# Patient Record
Sex: Female | Born: 1975 | Hispanic: Yes | Marital: Married | Smoking: Never smoker
Health system: Southern US, Community
[De-identification: ages and names within clinical notes are randomized; demographics above are authoritative.]

## PROBLEM LIST (undated history)

## (undated) DIAGNOSIS — F329 Major depressive disorder, single episode, unspecified: Secondary | ICD-10-CM

## (undated) DIAGNOSIS — F32A Depression, unspecified: Secondary | ICD-10-CM

## (undated) HISTORY — DX: Major depressive disorder, single episode, unspecified: F32.9

## (undated) HISTORY — DX: Depression, unspecified: F32.A

## (undated) HISTORY — PX: NO PAST SURGERIES: SHX2092

---

## 2017-05-21 ENCOUNTER — Emergency Department
Admission: EM | Admit: 2017-05-21 | Discharge: 2017-05-21 | Disposition: A | Discharge status: Home / Self Care | Payer: Medicaid Other | Attending: Emergency Medicine | Admitting: Emergency Medicine

## 2017-05-21 ENCOUNTER — Other Ambulatory Visit: Payer: Self-pay

## 2017-05-21 DIAGNOSIS — K2900 Acute gastritis without bleeding: Secondary | ICD-10-CM | POA: Diagnosis not present

## 2017-05-21 DIAGNOSIS — R1010 Upper abdominal pain, unspecified: Secondary | ICD-10-CM | POA: Diagnosis present

## 2017-05-21 LAB — COMPREHENSIVE METABOLIC PANEL
ALT: 21 U/L (ref 14–54)
AST: 22 U/L (ref 15–41)
Albumin: 3.4 g/dL — ABNORMAL LOW (ref 3.5–5.0)
Alkaline Phosphatase: 81 U/L (ref 38–126)
Anion gap: 5 (ref 5–15)
BUN: 22 mg/dL — ABNORMAL HIGH (ref 6–20)
CO2: 26 mmol/L (ref 22–32)
Calcium: 8.5 mg/dL — ABNORMAL LOW (ref 8.9–10.3)
Chloride: 108 mmol/L (ref 101–111)
Creatinine, Ser: 0.8 mg/dL (ref 0.44–1.00)
GFR calc Af Amer: >60 mL/min (ref >60)
GFR calc non Af Amer: >60 mL/min (ref >60)
Glucose, Bld: 93 mg/dL (ref 65–99)
Potassium: 3.9 mmol/L (ref 3.5–5.1)
Sodium: 139 mmol/L (ref 135–145)
Total Bilirubin: 0.5 mg/dL (ref 0.3–1.2)
Total Protein: 6.8 g/dL (ref 6.5–8.1)

## 2017-05-21 LAB — URINALYSIS, COMPLETE (UACMP) WITH MICROSCOPIC
Bilirubin Urine: NEGATIVE
Glucose, UA: NEGATIVE mg/dL
Hgb urine dipstick: NEGATIVE
Ketones, ur: NEGATIVE mg/dL
Nitrite: NEGATIVE
Protein, ur: NEGATIVE mg/dL
Specific Gravity, Urine: 1.017 (ref 1.005–1.030)
pH: 5 (ref 5.0–8.0)

## 2017-05-21 LAB — CBC
HCT: 37.2 % (ref 35.0–47.0)
Hemoglobin: 12.2 g/dL (ref 12.0–16.0)
MCH: 27.1 pg (ref 26.0–34.0)
MCHC: 32.7 g/dL (ref 32.0–36.0)
MCV: 83 fL (ref 80.0–100.0)
Platelets: 234 10*3/uL (ref 150–440)
RBC: 4.49 MIL/uL (ref 3.80–5.20)
RDW: 17.3 % — ABNORMAL HIGH (ref 11.5–14.5)
WBC: 6.9 10*3/uL (ref 3.6–11.0)

## 2017-05-21 LAB — LIPASE, BLOOD: Lipase: 26 U/L (ref 11–51)

## 2017-05-21 LAB — POCT PREGNANCY, URINE: Preg Test, Ur: NEGATIVE

## 2017-05-21 MED ORDER — GI COCKTAIL ~~LOC~~
30.0000 mL | Freq: Once | ORAL | Status: AC
  Administered 2017-05-21: 30 mL via ORAL
  Filled 2017-05-21: qty 30

## 2017-05-21 MED ORDER — SODIUM CHLORIDE 0.9 % IV SOLN
1000.0000 mL | Freq: Once | INTRAVENOUS | Status: AC
  Administered 2017-05-21: 1000 mL via INTRAVENOUS

## 2017-05-21 MED ORDER — ONDANSETRON HCL 4 MG/2ML IJ SOLN
4.0000 mg | Freq: Once | INTRAMUSCULAR | Status: AC
  Administered 2017-05-21: 4 mg via INTRAVENOUS
  Filled 2017-05-21: qty 2

## 2017-05-21 MED ORDER — ONDANSETRON 4 MG PO TBDP: 4.0000 mg | ORAL_TABLET | Freq: Three times a day (TID) | ORAL | 0 refills | Status: DC | PRN

## 2017-05-21 NOTE — ED Notes (Signed)
Spanish interpreter present at bedside

## 2017-05-21 NOTE — ED Triage Notes (Signed)
PT c/o abd pain with diarrhea and nausea for the past 2 days. Denies vomiting.

## 2017-05-21 NOTE — ED Provider Notes (Signed)
[facility] Emergency Department Provider Note   ____________________________________________    I have reviewed the triage vital signs and the nursing notes.   HISTORY  Chief Complaint Abdominal Pain   Spanish interpreter used  HPI Alicia Camacho is a 42 y.o. female who presents with complaints of abdominal pain nausea and vomiting.  Patient reports over the last 2 days she has had upper abdominal cramping discomfort which is been mild to moderate which worsens when she eats and she feels nauseated and vomits.  Has also had intermittent diarrhea.  No fevers reported.  No recent travel.  No history of abdominal surgery.  Denies medical history.  Has not taken anything for this except Alka-Seltzer with no significant improvement.  No sick contacts  History reviewed. No pertinent past medical history.  There are no active problems to display for this patient.   History reviewed. No pertinent surgical history.  Prior to Admission medications   Medication Sig Start Date End Date Taking? Authorizing Provider  ondansetron (ZOFRAN ODT) 4 MG disintegrating tablet Take 1 tablet (4 mg total) by mouth every 8 (eight) hours as needed for nausea or vomiting. 05/21/17   Jene Every, MD     Allergies Patient has no known allergies.  No family history on file.  Social History Social History   Tobacco Use  . Smoking status: Never Smoker  . Smokeless tobacco: Never Used  Substance Use Topics  . Alcohol use: Not Currently  . Drug use: Not Currently    Review of Systems  Constitutional: Subjective fever, positive fatigue Eyes: No visual changes.  ENT: No sore throat. Cardiovascular: Denies chest pain. Respiratory: Denies shortness of breath. Gastrointestinal: As above Genitourinary: Negative for dysuria. Musculoskeletal: Negative for joint swelling Skin: Negative for rash. Neurological: Negative for headaches     ____________________________________________   PHYSICAL EXAM:  VITAL SIGNS: ED Triage Vitals  Enc Vitals Group     BP 05/21/17 0725 118/74     Pulse Rate 05/21/17 0725 66     Resp 05/21/17 0725 17     Temp 05/21/17 0725 (!) 97.5 F (36.4 C)     Temp Source 05/21/17 0725 Oral     SpO2 05/21/17 0725 100 %     Weight 05/21/17 0726 61.7 kg (136 lb)     Height 05/21/17 0726 1.575 m (5\' 2" )     Head Circumference --      Peak Flow --      Pain Score 05/21/17 0726 7     Pain Loc --      Pain Edu? --      Excl. in GC? --     Constitutional: Alert and oriented.   Eyes: Conjunctivae are normal.   Nose: No congestion/rhinnorhea. Mouth/Throat: Mucous membranes are moist.   Neck:  Painless ROM Cardiovascular: Normal rate, regular rhythm. Grossly normal heart sounds.  Good peripheral circulation. Respiratory: Normal respiratory effort.  No retractions. Lungs CTAB. Gastrointestinal: Mild tenderness over the epigastrium.  No distention.  No CVA tenderness. Genitourinary: deferred Musculoskeletal:   Warm and well perfused Neurologic:  Normal speech and language. No gross focal neurologic deficits are appreciated.  Skin:  Skin is warm, dry and intact. No rash noted. Psychiatric: Mood and affect are normal. Speech and behavior are normal.  ____________________________________________   LABS (all labs ordered are listed, but only abnormal results are displayed)  Labs Reviewed  CBC - Abnormal; Notable for the following components:      Result Value  RDW 17.3 (*)    All other components within normal limits  COMPREHENSIVE METABOLIC PANEL - Abnormal; Notable for the following components:   BUN 22 (*)    Calcium 8.5 (*)    Albumin 3.4 (*)    All other components within normal limits  URINALYSIS, COMPLETE (UACMP) WITH MICROSCOPIC - Abnormal; Notable for the following components:   Color, Urine YELLOW (*)    APPearance CLOUDY (*)    Leukocytes, UA SMALL (*)    Bacteria, UA FEW  (*)    Squamous Epithelial / LPF 6-30 (*)    All other components within normal limits  LIPASE, BLOOD  POCT PREGNANCY, URINE   ____________________________________________  EKG  None ____________________________________________  RADIOLOGY  None ____________________________________________   PROCEDURES  Procedure(s) performed: No  Procedures   Critical Care performed: No ____________________________________________   INITIAL IMPRESSION / ASSESSMENT AND PLAN / ED COURSE  Pertinent labs & imaging results that were available during my care of the patient were reviewed by me and considered in my medical decision making (see chart for details).  Patient overall well-appearing, vital signs are reassuring.  Exam significant only for very minimal tenderness over the epigastrium, no tenderness over the gallbladder.  Suspect gastritis, likely viral, will treat with IV fluids, IV Zofran, check labs and reevaluate.  Patient felt better after treatment, suspect viral gastritis.  Lab work is overall reassuring.  Will treat supportively, outpatient follow-up as needed.  Return precautions discussed with interpreter      ____________________________________________   FINAL CLINICAL IMPRESSION(S) / ED DIAGNOSES  Final diagnoses:  Acute gastritis without hemorrhage, unspecified gastritis type        Note:  This document was prepared using Dragon voice recognition software and may include unintentional dictation errors.    Jene EveryKinner, Jenaveve Fenstermaker, MD 05/21/17 [phone]

## 2017-09-21 ENCOUNTER — Encounter (INDEPENDENT_AMBULATORY_CARE_PROVIDER_SITE_OTHER): Payer: Self-pay | Admitting: Vascular Surgery

## 2017-09-21 ENCOUNTER — Ambulatory Visit (INDEPENDENT_AMBULATORY_CARE_PROVIDER_SITE_OTHER): Payer: Medicaid Other | Admitting: Vascular Surgery

## 2017-09-21 ENCOUNTER — Encounter (INDEPENDENT_AMBULATORY_CARE_PROVIDER_SITE_OTHER): Payer: Self-pay

## 2017-09-21 VITALS — BP 104/68 | HR 77 | Resp 18 | Ht 62.0 in | Wt 141.0 lb

## 2017-09-21 DIAGNOSIS — R600 Localized edema: Secondary | ICD-10-CM | POA: Diagnosis not present

## 2017-09-21 DIAGNOSIS — I83813 Varicose veins of bilateral lower extremities with pain: Secondary | ICD-10-CM | POA: Insufficient documentation

## 2017-09-21 NOTE — Progress Notes (Signed)
Subjective:    Patient ID: Alicia Camacho, female    DOB: March 31, 1975, 42 y.o.   MRN: [id number] Chief Complaint  Patient presents with  . New Patient (Initial Visit)    ref for Varicose Veins   Patient presents from the [facility] as a new patient for evaluation of painful varicose veins.  The patient endorses a long-standing history of varicose veins located to the bilateral legs which have progressively become more painful over time.  The patient first noticed these varicosities after her 3 pregnancies.  The patient notes a progressive increase in number, size and discomfort over the past few years.  The patient also experiences some bilateral lower extremity edema.  This edema is also associated with some discomfort.  The patient feels that her symptoms have progressed to the point that she is unable to function on a daily basis and they have become lifestyle limiting.  At this time, the patient does not engage in conservative therapy including wearing medical grade 1 compression socks, elevating her legs and remaining active.  The patient denies any recent surgery or trauma to the bilateral legs.  The patient does not have a DVT history.  The patient denies any claudication-like symptoms, rest pain or ulcer formation to the bilateral lower extremity.  The patient has never been diagnosed with cellulitis.  The patient denies any fever, nausea vomiting.  Review of Systems  Constitutional: Negative.   HENT: Negative.   Eyes: Negative.   Respiratory: Negative.   Cardiovascular:       Painful varicose veins  Gastrointestinal: Negative.   Endocrine: Negative.   Genitourinary: Negative.   Musculoskeletal: Negative.   Skin: Negative.   Allergic/Immunologic: Negative.   Neurological: Negative.   Hematological: Negative.   Psychiatric/Behavioral: Negative.       Objective:   Physical Exam  Constitutional: She is oriented to person, place, and time. She appears  well-developed and well-nourished. No distress.  HENT:  Head: Normocephalic and atraumatic.  Right Ear: External ear normal.  Left Ear: External ear normal.  Eyes: Pupils are equal, round, and reactive to light. Conjunctivae and EOM are normal.  Neck: Normal range of motion.  Cardiovascular: Normal rate, regular rhythm, normal heart sounds and intact distal pulses.  Pulses:      Radial pulses are 2+ on the right side, and 2+ on the left side.       Dorsalis pedis pulses are 2+ on the right side, and 2+ on the left side.       Posterior tibial pulses are 2+ on the right side, and 2+ on the left side.  Patient with greater than 1 cm and less than 1 cm diffuse varicosities noted to the bilateral lower extremity.  There is no stasis dermatitis, fibrosis, cellulitis or active ulcerations noted to the bilateral legs.  Pulmonary/Chest: Effort normal and breath sounds normal.  Musculoskeletal: Normal range of motion. She exhibits edema (Mild nonpitting bilateral lower extremity edema).  Neurological: She is alert and oriented to person, place, and time.  Skin: Skin is warm and dry. She is not diaphoretic.  Psychiatric: She has a normal mood and affect. Her behavior is normal. Judgment and thought content normal.  Vitals reviewed.  BP 104/68 (BP Location: Right Arm)   Pulse 77   Resp 18   Ht 5\' 2"  (1.575 m)   Wt 141 lb (64 kg)   BMI 25.79 kg/m   Past Medical History:  Diagnosis Date  . Depression  Social History   Socioeconomic History  . Marital status: Married    Spouse name: Not on file  . Number of children: Not on file  . Years of education: Not on file  . Highest education level: Not on file  Occupational History  . Not on file  Social Needs  . Financial resource strain: Not on file  . Food insecurity:    Worry: Not on file    Inability: Not on file  . Transportation needs:    Medical: Not on file    Non-medical: Not on file  Tobacco Use  . Smoking status: Never  Smoker  . Smokeless tobacco: Never Used  Substance and Sexual Activity  . Alcohol use: Not Currently  . Drug use: Not Currently  . Sexual activity: Not on file  Lifestyle  . Physical activity:    Days per week: Not on file    Minutes per session: Not on file  . Stress: Not on file  Relationships  . Social connections:    Talks on phone: Not on file    Gets together: Not on file    Attends religious service: Not on file    Active member of club or organization: Not on file    Attends meetings of clubs or organizations: Not on file    Relationship status: Not on file  . Intimate partner violence:    Fear of current or ex partner: Not on file    Emotionally abused: Not on file    Physically abused: Not on file    Forced sexual activity: Not on file  Other Topics Concern  . Not on file  Social History Narrative  . Not on file   Past Surgical History:  Procedure Laterality Date  . NO PAST SURGERIES     Family History  Problem Relation Age of Onset  . Varicose Veins Mother   . Cancer Mother    No Known Allergies     Assessment & Plan:  Patient presents from the [facility] community [facility] center as a new patient for evaluation of painful varicose veins.  The patient endorses a long-standing history of varicose veins located to the bilateral legs which have progressively become more painful over time.  The patient first noticed these varicosities after her 3 pregnancies.  The patient notes a progressive increase in number, size and discomfort over the past few years.  The patient also experiences some bilateral lower extremity edema.  This edema is also associated with some discomfort.  The patient feels that her symptoms have progressed to the point that she is unable to function on a daily basis and they have become lifestyle limiting.  At this time, the patient does not engage in conservative therapy including wearing medical grade 1 compression socks, elevating her legs and  remaining active.  The patient denies any recent surgery or trauma to the bilateral legs.  The patient does not have a DVT history.  The patient denies any claudication-like symptoms, rest pain or ulcer formation to the bilateral lower extremity.  The patient has never been diagnosed with cellulitis.  The patient denies any fever, nausea vomiting.  1. Varicose veins of both lower extremities with pain - Stable The patient was encouraged to wear graduated compression stockings (20-30 mmHg) on a daily basis. The patient was instructed to begin wearing the stockings first thing in the morning and removing them in the evening. The patient was instructed specifically not to sleep in the stockings. Prescription given.  In addition, behavioral modification including elevation during the day will be initiated. Anti-inflammatories for pain. I will bring the patient back in 3 months to assess her progress with conservative therapy I will also bring the patient back and have her undergo bilateral lower extremity venous duplex to rule out any venous versus lymphatic disease.  Information on compression stockings was given to the patient. The patient was instructed to call the office in the interim if any worsening edema or ulcerations to the legs, feet or toes occurs. The patient expresses their understanding.  - VAS US LOWER EXTREMITY VENOUS REFLUX; Future  2. Bilateral lower extremity edema - Stable As above  Current Outpatient Medications on File Prior to Visit  Medication Sig Dispense Refill  . ferrous sulfate 325 (65 FE) MG tablet Take 325 mg by mouth daily with breakfast.    . FLUoxetine (PROZAC) 10 MG capsule Take 10 mg by mouth daily.    . fluticasone (FLONASE) 50 MCG/ACT nasal spray Place into both nostrils daily.    . Loratadine 10 MG CAPS Take by mouth daily.    . Melatonin 1 MG TABS Take by mouth at bedtime.    . ondansetron (ZOFRAN ODT) 4 MG disintegrating tablet Take 1 tablet (4 mg total)  by mouth every 8 (eight) hours as needed for nausea or vomiting. (Patient not taking: Reported on 09/21/2017) 20 tablet 0   No current facility-administered medications on file prior to visit.     There are no Patient Instructions on file for this visit. No follow-ups on file.   Lateefa Crosby A [place], PA-C

## 2017-11-14 ENCOUNTER — Other Ambulatory Visit: Payer: Self-pay

## 2017-11-14 ENCOUNTER — Emergency Department
Admission: EM | Admit: 2017-11-14 | Discharge: 2017-11-14 | Disposition: A | Discharge status: Home / Self Care | Payer: Medicaid Other | Attending: Emergency Medicine | Admitting: Emergency Medicine

## 2017-11-14 ENCOUNTER — Encounter: Payer: Self-pay | Admitting: Emergency Medicine

## 2017-11-14 DIAGNOSIS — B9789 Other viral agents as the cause of diseases classified elsewhere: Secondary | ICD-10-CM

## 2017-11-14 DIAGNOSIS — J029 Acute pharyngitis, unspecified: Secondary | ICD-10-CM | POA: Insufficient documentation

## 2017-11-14 DIAGNOSIS — J069 Acute upper respiratory infection, unspecified: Secondary | ICD-10-CM

## 2017-11-14 LAB — GROUP A STREP BY PCR: Group A Strep by PCR: NOT DETECTED

## 2017-11-14 LAB — INFLUENZA PANEL BY PCR (TYPE A & B)
Influenza A By PCR: NEGATIVE
Influenza B By PCR: NEGATIVE

## 2017-11-14 MED ORDER — BENZONATATE 100 MG PO CAPS
200.0000 mg | ORAL_CAPSULE | Freq: Once | ORAL | Status: AC
  Administered 2017-11-14: 200 mg via ORAL
  Filled 2017-11-14: qty 2

## 2017-11-14 MED ORDER — PSEUDOEPH-BROMPHEN-DM 30-2-10 MG/5ML PO SYRP: 5.0000 mL | ORAL_SOLUTION | Freq: Four times a day (QID) | ORAL | 0 refills | Status: DC | PRN

## 2017-11-14 MED ORDER — IBUPROFEN 600 MG PO TABS
600.0000 mg | ORAL_TABLET | Freq: Once | ORAL | Status: AC
Start: 2017-11-14 — End: 2017-11-14
  Administered 2017-11-14: 600 mg via ORAL
  Filled 2017-11-14: qty 1

## 2017-11-14 MED ORDER — LIDOCAINE VISCOUS HCL 2 % MT SOLN: 5.0000 mL | Freq: Four times a day (QID) | OROMUCOSAL | 0 refills | Status: DC | PRN

## 2017-11-14 NOTE — ED Provider Notes (Signed)
[facility] Emergency Department Provider Note  ____________________________________________   First MD Initiated Contact with Patient 11/14/17 [phone]     (approximate)  I have reviewed the triage vital signs and the nursing notes.   HISTORY  Chief Complaint Cough and Sore Throat    HPI Alicia Camacho is a 42 y.o. female patient presents with cough and sore throat for 3 days.  Patient denies nausea, vomiting, diarrhea.  Patient also states body ache.  Patient states the cough is nonproductive.  Patient states she can tolerate food and fluids with difficulty.  No palliative measures for complaint.  Patient rates her pain discomfort a 10/10.  Patient described the pain as "sore/achy".   Past Medical History:  Diagnosis Date  . Depression     Patient Active Problem List   Diagnosis Date Noted  . Varicose veins of both lower extremities with pain 09/21/2017  . Bilateral lower extremity edema 09/21/2017    Past Surgical History:  Procedure Laterality Date  . NO PAST SURGERIES      Prior to Admission medications   Medication Sig Start Date End Date Taking? Authorizing Provider  brompheniramine-pseudoephedrine-DM 30-2-10 MG/5ML syrup Take 5 mLs by mouth 4 (four) times daily as needed. Mix with 5 mL of viscous lidocaine for swish and swallow. 11/14/17   Joni ReiningSmith, Donavon Kimrey K, PA-C  ferrous sulfate 325 (65 FE) MG tablet Take 325 mg by mouth daily with breakfast.    [provider]  FLUoxetine (PROZAC) 10 MG capsule Take 10 mg by mouth daily.    [provider]  fluticasone (FLONASE) 50 MCG/ACT nasal spray Place into both nostrils daily.    [provider]  lidocaine (XYLOCAINE) 2 % solution Use as directed 5 mLs in the mouth or throat every 6 (six) hours as needed for mouth pain. Mix with Bromfed-DM for swish and swallow. 11/14/17   Joni ReiningSmith, Anndee Connett K, PA-C  Loratadine 10 MG CAPS Take by mouth daily.    [provider]  Melatonin 1 MG  TABS Take by mouth at bedtime.    [provider]  ondansetron (ZOFRAN ODT) 4 MG disintegrating tablet Take 1 tablet (4 mg total) by mouth every 8 (eight) hours as needed for nausea or vomiting. Patient not taking: Reported on 09/21/2017 05/21/17   Jene EveryKinner, Robert, MD    Allergies Patient has no known allergies.  Family History  Problem Relation Age of Onset  . Varicose Veins Mother   . Cancer Mother     Social History Social History   Tobacco Use  . Smoking status: Never Smoker  . Smokeless tobacco: Never Used  Substance Use Topics  . Alcohol use: Not Currently  . Drug use: Not Currently    Review of Systems Constitutional: No fever/chills Eyes: No visual changes. ENT: No sore throat. Cardiovascular: Denies chest pain. Respiratory: Denies shortness of breath. Gastrointestinal: No abdominal pain.  No nausea, no vomiting.  No diarrhea.  No constipation. Genitourinary: Negative for dysuria. Musculoskeletal: Negative for back pain. Skin: Negative for rash. Neurological: Negative for headaches, focal weakness or numbness. Psychiatric: Depression  ____________________________________________   PHYSICAL EXAM:  VITAL SIGNS: ED Triage Vitals  Enc Vitals Group     BP 11/14/17 0934 106/63     Pulse Rate 11/14/17 0934 79     Resp 11/14/17 0934 18     Temp 11/14/17 0934 98 F (36.7 C)     Temp Source 11/14/17 0934 Oral     SpO2 11/14/17 0934 99 %  Weight 11/14/17 0935 137 lb (62.1 kg)     Height 11/14/17 0935 5\' 5"  (1.651 m)     Head Circumference --      Peak Flow --      Pain Score 11/14/17 0935 10     Pain Loc --      Pain Edu? --      Excl. in GC? --    Constitutional: Alert and oriented. Well appearing and in no acute distress. Nose: Edematous nasal turbinates clear rhinorrhea. Mouth/Throat: Mucous membranes are moist.  Oropharynx erythematous.  Edematous but not exudative tonsils. Neck: No stridor.  Hematological/Lymphatic/Immunilogical: No cervical  lymphadenopathy. Cardiovascular: Normal rate, regular rhythm. Grossly normal heart sounds.  Good peripheral circulation. Respiratory: Normal respiratory effort.  No retractions. Lungs CTAB. Skin:  Skin is warm, dry and intact. No rash noted. Psychiatric: Mood and affect are normal. Speech and behavior are normal.  ____________________________________________   LABS (all labs ordered are listed, but only abnormal results are displayed)  Labs Reviewed  GROUP A STREP BY PCR  INFLUENZA PANEL BY PCR (TYPE A & B)   ____________________________________________  EKG   ____________________________________________  RADIOLOGY  ED MD interpretation:    Official radiology report(s): No results found.  ____________________________________________   PROCEDURES  Procedure(s) performed: None  Procedures  Critical Care performed: No  ____________________________________________   INITIAL IMPRESSION / ASSESSMENT AND PLAN / ED COURSE  As part of my medical decision making, I reviewed the following data within the electronic MEDICAL RECORD NUMBER    Viral illness with pharyngitis.  Discussed negative flu and strep results with patient.  Patient given discharge care instruction advised take medication as directed.  Patient advised follow-up PCP.      ____________________________________________   FINAL CLINICAL IMPRESSION(S) / ED DIAGNOSES  Final diagnoses:  Viral pharyngitis  Viral URI with cough     ED Discharge Orders         Ordered    lidocaine (XYLOCAINE) 2 % solution  Every 6 hours PRN     11/14/17 1147    brompheniramine-pseudoephedrine-DM 30-2-10 MG/5ML syrup  4 times daily PRN     11/14/17 1147           Note:  This document was prepared using Dragon voice recognition software and may include unintentional dictation errors.    Joni Reining, PA-C 11/14/17 1157    Jene Every, MD 11/16/17 [phone]

## 2017-11-14 NOTE — ED Triage Notes (Signed)
Cough and sore throat x 3 days.  

## 2017-12-03 ENCOUNTER — Ambulatory Visit (INDEPENDENT_AMBULATORY_CARE_PROVIDER_SITE_OTHER): Payer: Medicaid Other | Admitting: Vascular Surgery

## 2017-12-03 ENCOUNTER — Encounter (INDEPENDENT_AMBULATORY_CARE_PROVIDER_SITE_OTHER): Payer: Medicaid Other

## 2017-12-10 ENCOUNTER — Ambulatory Visit (INDEPENDENT_AMBULATORY_CARE_PROVIDER_SITE_OTHER): Payer: Medicaid Other | Admitting: Vascular Surgery

## 2017-12-10 ENCOUNTER — Encounter (INDEPENDENT_AMBULATORY_CARE_PROVIDER_SITE_OTHER): Payer: Self-pay | Admitting: Vascular Surgery

## 2017-12-10 ENCOUNTER — Ambulatory Visit (INDEPENDENT_AMBULATORY_CARE_PROVIDER_SITE_OTHER): Payer: Medicaid Other

## 2017-12-10 VITALS — BP 111/72 | HR 65 | Resp 16 | Wt 140.4 lb

## 2017-12-10 DIAGNOSIS — I890 Lymphedema, not elsewhere classified: Secondary | ICD-10-CM | POA: Diagnosis not present

## 2017-12-10 DIAGNOSIS — I872 Venous insufficiency (chronic) (peripheral): Secondary | ICD-10-CM

## 2017-12-10 DIAGNOSIS — I83813 Varicose veins of bilateral lower extremities with pain: Secondary | ICD-10-CM

## 2017-12-14 ENCOUNTER — Encounter (INDEPENDENT_AMBULATORY_CARE_PROVIDER_SITE_OTHER): Payer: Self-pay | Admitting: Vascular Surgery

## 2017-12-14 DIAGNOSIS — I872 Venous insufficiency (chronic) (peripheral): Secondary | ICD-10-CM | POA: Insufficient documentation

## 2017-12-14 DIAGNOSIS — I890 Lymphedema, not elsewhere classified: Secondary | ICD-10-CM | POA: Insufficient documentation

## 2017-12-14 NOTE — Progress Notes (Signed)
Subjective:    Patient ID: Alicia Camacho, female    DOB: Oct 28, 1975, 42 y.o.   MRN: [id number] Chief Complaint  Patient presents with  . Follow-up    59month bil venous reflux   Patient presents for a 86-month follow-up.  The patient was initially seen with a chief complaint of painful varicosities noted to the lower extremity and edema.  Since her initial visit, the patient has been engaging in conservative therapy including wearing medical grade one compression socks, elevating her legs and remaining active with minimal improvement of her symptoms.  The patient does a lot of standing for employment and feels that her symptoms have progressed to the point that she is unable to function on a daily basis and they have become lifestyle limiting.  Conservative therapy has not improved her symptoms and she still experiences significant pain along her varicosities as well as edema which is also associated with discomfort.  The patient underwent a bilateral lower extremity venous duplex which was notable for reflux in the left popliteal vein.  A Baker's cyst was also seen behind the left knee.  Reflux was seen in the right great saphenous vein.  There was no evidence of deep vein or superficial thrombophlebitis.  The patient denies any claudication-like symptoms, rest pain or ulcer formation to the bilateral lower extremity.  Patient denies any fever, nausea vomiting.  Review of Systems  Constitutional: Negative.   HENT: Negative.   Eyes: Negative.   Respiratory: Negative.   Cardiovascular: Positive for leg swelling.       Painful varicose veins.  Gastrointestinal: Negative.   Endocrine: Negative.   Genitourinary: Negative.   Musculoskeletal: Negative.   Skin: Negative.   Allergic/Immunologic: Negative.   Neurological: Negative.   Hematological: Negative.   Psychiatric/Behavioral: Negative.       Objective:   Physical Exam  Constitutional: She is oriented to person, place, and time. She  appears well-developed and well-nourished. No distress.  HENT:  Head: Normocephalic.  Right Ear: External ear normal.  Left Ear: External ear normal.  Eyes: Pupils are equal, round, and reactive to light. Conjunctivae and EOM are normal.  Neck: Normal range of motion.  Cardiovascular: Normal rate, regular rhythm, normal heart sounds and intact distal pulses.  Pulses:      Radial pulses are 2+ on the right side, and 2+ on the left side.       Dorsalis pedis pulses are 2+ on the right side, and 2+ on the left side.       Posterior tibial pulses are 2+ on the right side, and 2+ on the left side.  Pulmonary/Chest: Effort normal and breath sounds normal.  Musculoskeletal: Normal range of motion. She exhibits edema (Mild to moderate 1+ pitting edema noted bilaterally).  Neurological: She is alert and oriented to person, place, and time.  Skin: She is not diaphoretic.  Left lower extremity: Large greater than 1 cm varicosities noted just under the buttocks Right lower extremity: Diffuse greater than and less than 1 cm varicosities noted. Mild stasis dermatitis. There is no fibrosis, cellulitis or active ulcerations noted.  Vitals reviewed.  BP 111/72 (BP Location: Right Arm)   Pulse 65   Resp 16   Wt 140 lb 6.4 oz (63.7 kg)   BMI 23.36 kg/m   Past Medical History:  Diagnosis Date  . Depression    Social History   Socioeconomic History  . Marital status: Married    Spouse name: Not on file  . Number  of children: Not on file  . Years of education: Not on file  . Highest education level: Not on file  Occupational History  . Not on file  Social Needs  . Financial resource strain: Not on file  . Food insecurity:    Worry: Not on file    Inability: Not on file  . Transportation needs:    Medical: Not on file    Non-medical: Not on file  Tobacco Use  . Smoking status: Never Smoker  . Smokeless tobacco: Never Used  Substance and Sexual Activity  . Alcohol use: Not Currently    . Drug use: Not Currently  . Sexual activity: Not on file  Lifestyle  . Physical activity:    Days per week: Not on file    Minutes per session: Not on file  . Stress: Not on file  Relationships  . Social connections:    Talks on phone: Not on file    Gets together: Not on file    Attends religious service: Not on file    Active member of club or organization: Not on file    Attends meetings of clubs or organizations: Not on file    Relationship status: Not on file  . Intimate partner violence:    Fear of current or ex partner: Not on file    Emotionally abused: Not on file    Physically abused: Not on file    Forced sexual activity: Not on file  Other Topics Concern  . Not on file  Social History Narrative  . Not on file   Past Surgical History:  Procedure Laterality Date  . NO PAST SURGERIES     Family History  Problem Relation Age of Onset  . Varicose Veins Mother   . Cancer Mother    No Known Allergies     Assessment & Plan:  Patient presents for a 57-month follow-up.  The patient was initially seen with a chief complaint of painful varicosities noted to the lower extremity and edema.  Since her initial visit, the patient has been engaging in conservative therapy including wearing medical grade one compression socks, elevating her legs and remaining active with minimal improvement of her symptoms.  The patient does a lot of standing for employment and feels that her symptoms have progressed to the point that she is unable to function on a daily basis and they have become lifestyle limiting.  Conservative therapy has not improved her symptoms and she still experiences significant pain along her varicosities as well as edema which is also associated with discomfort.  The patient underwent a bilateral lower extremity venous duplex which was notable for reflux in the left popliteal vein.  A Baker's cyst was also seen behind the left knee.  Reflux was seen in the right great  saphenous vein.  There was no evidence of deep vein or superficial thrombophlebitis.  The patient denies any claudication-like symptoms, rest pain or ulcer formation to the bilateral lower extremity.  Patient denies any fever, nausea vomiting.  1. Chronic venous insufficiency - New Patient was found to have chronic venous insufficiency in the left popliteal vein.  Due to the location/anatomy of this reflux she is not a candidate for endovenous laser ablation to the left popliteal vein. Chronic venous insufficiency was found to the great saphenous vein in the right lower extremity. The patient is likely to benefit from endovenous laser ablation to the right GSV. I have discussed the risks and benefits of the procedure.  The risks primarily include DVT, recanalization, bleeding, infection, and inability to gain access. I will applied to the patient's insurance We will call the patient with insurance approval In the meantime the patient is to continue engaging in conservative therapy  2. Lymphedema - New The patient would like to pursue undergoing some vein work initially If the patient is still symptomatic we discussed the lymphedema pump as an additional/alternative option  3. Varicose veins of both lower extremities with pain - Stable The patient does have a large painful greater than 1 cm varicosity noted to the back of the left lower extremity just under the buttocks The patient would benefit from foam sclerotherapy to this area to relieve her symptoms I will apply to her insurance We will call her with insurance approval  Current Outpatient Medications on File Prior to Visit  Medication Sig Dispense Refill  . brompheniramine-pseudoephedrine-DM 30-2-10 MG/5ML syrup Take 5 mLs by mouth 4 (four) times daily as needed. Mix with 5 mL of viscous lidocaine for swish and swallow. (Patient not taking: Reported on 12/10/2017) 120 mL 0  . ferrous sulfate 325 (65 FE) MG tablet Take 325 mg by mouth daily  with breakfast.    . FLUoxetine (PROZAC) 10 MG capsule Take 10 mg by mouth daily.    . fluticasone (FLONASE) 50 MCG/ACT nasal spray Place into both nostrils daily.    Marland Kitchen lidocaine (XYLOCAINE) 2 % solution Use as directed 5 mLs in the mouth or throat every 6 (six) hours as needed for mouth pain. Mix with Bromfed-DM for swish and swallow. (Patient not taking: Reported on 12/10/2017) 100 mL 0  . Loratadine 10 MG CAPS Take by mouth daily.    . Melatonin 1 MG TABS Take by mouth at bedtime.    . ondansetron (ZOFRAN ODT) 4 MG disintegrating tablet Take 1 tablet (4 mg total) by mouth every 8 (eight) hours as needed for nausea or vomiting. (Patient not taking: Reported on 09/21/2017) 20 tablet 0   No current facility-administered medications on file prior to visit.    There are no Patient Instructions on file for this visit. No follow-ups on file.  Shakeya Kerkman A Cylan Borum, PA-C

## 2018-02-11 ENCOUNTER — Ambulatory Visit (INDEPENDENT_AMBULATORY_CARE_PROVIDER_SITE_OTHER): Payer: Medicaid Other | Admitting: Vascular Surgery

## 2018-02-11 ENCOUNTER — Encounter (INDEPENDENT_AMBULATORY_CARE_PROVIDER_SITE_OTHER): Payer: Self-pay | Admitting: Vascular Surgery

## 2018-02-11 DIAGNOSIS — I83813 Varicose veins of bilateral lower extremities with pain: Secondary | ICD-10-CM | POA: Diagnosis not present

## 2018-02-11 NOTE — Progress Notes (Signed)
    MRN : [id number]  Alicia Camacho is a 42 y.o. (1975/04/21) female who presents with chief complaint of No chief complaint on file. .    The patient's right lower extremity was sterilely prepped and draped.  The ultrasound machine was used to visualize the right great saphenous vein throughout its course.  A segment at the knee was selected for access.  The saphenous vein was accessed without difficulty using ultrasound guidance with a micropuncture needle.   An 0.018  wire was placed beyond the saphenofemoral junction through the sheath and the microneedle was removed.  The 65 cm sheath was then placed over the wire and the wire and dilator were removed.  The laser fiber was placed through the sheath and its tip was placed approximately 2 cm below the saphenofemoral junction.  Tumescent anesthesia was then created with a dilute lidocaine solution.  Laser energy was then delivered with constant withdrawal of the sheath and laser fiber.  Approximately 1138 Joules of energy were delivered over a length of 32 cm.  Sterile dressings were placed.  The patient tolerated the procedure well without complications.

## 2018-02-15 ENCOUNTER — Other Ambulatory Visit (INDEPENDENT_AMBULATORY_CARE_PROVIDER_SITE_OTHER): Payer: Self-pay | Admitting: Vascular Surgery

## 2018-02-15 ENCOUNTER — Ambulatory Visit (INDEPENDENT_AMBULATORY_CARE_PROVIDER_SITE_OTHER): Payer: Medicaid Other

## 2018-02-15 DIAGNOSIS — I872 Venous insufficiency (chronic) (peripheral): Secondary | ICD-10-CM

## 2018-03-10 ENCOUNTER — Emergency Department: Payer: Medicaid Other

## 2018-03-10 ENCOUNTER — Emergency Department
Admission: EM | Admit: 2018-03-10 | Discharge: 2018-03-10 | Disposition: A | Discharge status: Home / Self Care | Payer: Medicaid Other | Attending: Student in an Organized Health Care Education/Training Program | Admitting: Student in an Organized Health Care Education/Training Program

## 2018-03-10 ENCOUNTER — Other Ambulatory Visit: Payer: Self-pay

## 2018-03-10 DIAGNOSIS — Z79899 Other long term (current) drug therapy: Secondary | ICD-10-CM | POA: Insufficient documentation

## 2018-03-10 DIAGNOSIS — J209 Acute bronchitis, unspecified: Secondary | ICD-10-CM | POA: Diagnosis not present

## 2018-03-10 DIAGNOSIS — R05 Cough: Secondary | ICD-10-CM | POA: Diagnosis present

## 2018-03-10 MED ORDER — PREDNISONE 50 MG PO TABS: 50.0000 mg | ORAL_TABLET | Freq: Every day | ORAL | 0 refills | Status: DC

## 2018-03-10 MED ORDER — PSEUDOEPH-BROMPHEN-DM 30-2-10 MG/5ML PO SYRP: 10.0000 mL | ORAL_SOLUTION | Freq: Four times a day (QID) | ORAL | 0 refills | Status: DC | PRN

## 2018-03-10 MED ORDER — PREDNISONE 20 MG PO TABS
60.0000 mg | ORAL_TABLET | Freq: Once | ORAL | Status: AC
  Administered 2018-03-10: 60 mg via ORAL
  Filled 2018-03-10: qty 3

## 2018-03-10 MED ORDER — ALBUTEROL SULFATE HFA 108 (90 BASE) MCG/ACT IN AERS: 2.0000 | INHALATION_SPRAY | RESPIRATORY_TRACT | 0 refills | Status: DC | PRN

## 2018-03-10 NOTE — ED Provider Notes (Signed)
[facility] Emergency Department Provider Note  ____________________________________________  Time seen: Approximately 8:50 PM  I have reviewed the triage vital signs and the nursing notes.   HISTORY  Chief Complaint Cough and Sore Throat  Interpreter was used  HPI Alicia Camacho is a 43 y.o. female who presents the emergency department complaining of nasal congestion, sore throat, coughing, burning sensation with coughing.  Patient reports slow onset of symptoms beginning 5 days ago.  She is tried over-the-counter medications including Tylenol, Motrin, DayQuil, NyQuil, tea with honey.  Patient reports that these were not alleviating her symptoms.  Patient denies any headache, visual changes, neck pain or stiffness, chest pain, domino pain, nausea vomiting, diarrhea or constipation.    Past Medical History:  Diagnosis Date  . Depression     Patient Active Problem List   Diagnosis Date Noted  . Chronic venous insufficiency 12/14/2017  . Lymphedema 12/14/2017  . Varicose veins of both lower extremities with pain 09/21/2017  . Bilateral lower extremity edema 09/21/2017    Past Surgical History:  Procedure Laterality Date  . NO PAST SURGERIES      Prior to Admission medications   Medication Sig Start Date End Date Taking? Authorizing Provider  albuterol (PROVENTIL HFA;VENTOLIN HFA) 108 (90 Base) MCG/ACT inhaler Inhale 2 puffs into the lungs every 4 (four) hours as needed for wheezing or shortness of breath. 03/10/18   Cuthriell, Delorise Royals, PA-C  brompheniramine-pseudoephedrine-DM 30-2-10 MG/5ML syrup Take 10 mLs by mouth 4 (four) times daily as needed. 03/10/18   Cuthriell, Delorise Royals, PA-C  ferrous sulfate 325 (65 FE) MG tablet Take 325 mg by mouth daily with breakfast.    [provider]  FLUoxetine (PROZAC) 10 MG capsule Take 10 mg by mouth daily.    [provider]  fluticasone (FLONASE) 50 MCG/ACT nasal spray Place into both nostrils  daily.    [provider]  lidocaine (XYLOCAINE) 2 % solution Use as directed 5 mLs in the mouth or throat every 6 (six) hours as needed for mouth pain. Mix with Bromfed-DM for swish and swallow. Patient not taking: Reported on 12/10/2017 11/14/17   Joni Reining, PA-C  Loratadine 10 MG CAPS Take by mouth daily.    [provider]  Melatonin 1 MG TABS Take by mouth at bedtime.    [provider]  ondansetron (ZOFRAN ODT) 4 MG disintegrating tablet Take 1 tablet (4 mg total) by mouth every 8 (eight) hours as needed for nausea or vomiting. Patient not taking: Reported on 09/21/2017 05/21/17   Jene Every, MD  predniSONE (DELTASONE) 50 MG tablet Take 1 tablet (50 mg total) by mouth daily with breakfast. 03/10/18   Cuthriell, Delorise Royals, PA-C    Allergies Patient has no known allergies.  Family History  Problem Relation Age of Onset  . Varicose Veins Mother   . Cancer Mother     Social History Social History   Tobacco Use  . Smoking status: Never Smoker  . Smokeless tobacco: Never Used  Substance Use Topics  . Alcohol use: Not Currently  . Drug use: Not Currently     Review of Systems  Constitutional: No fever/chills Eyes: No visual changes. No discharge ENT: Positive for nasal congestion and sore throat Cardiovascular: no chest pain. Respiratory: Positive cough. No SOB. Gastrointestinal: No abdominal pain.  No nausea, no vomiting.  No diarrhea.  No constipation. Musculoskeletal: Negative for musculoskeletal pain. Skin: Negative for rash, abrasions, lacerations, ecchymosis. Neurological: Negative for headaches, focal weakness  or numbness. 10-point ROS otherwise negative.  ____________________________________________   PHYSICAL EXAM:  VITAL SIGNS: ED Triage Vitals  Enc Vitals Group     BP 03/10/18 1751 112/67     Pulse Rate 03/10/18 1749 99     Resp 03/10/18 1749 19     Temp 03/10/18 1749 98.4 F (36.9 C)     Temp src --      SpO2  03/10/18 1749 99 %     Weight 03/10/18 1749 135 lb (61.2 kg)     Height 03/10/18 1749 5\' 2"  (1.575 m)     Head Circumference --      Peak Flow --      Pain Score 03/10/18 1749 5     Pain Loc --      Pain Edu? --      Excl. in GC? --      Constitutional: Alert and oriented. Well appearing and in no acute distress. Eyes: Conjunctivae are normal. PERRL. EOMI. Head: Atraumatic. ENT:      Ears: EACs and TMs unremarkable bilaterally.      Nose: No congestion/rhinnorhea.      Mouth/Throat: Mucous membranes are moist.  Oropharynx is minimally erythematous but nonedematous.  Uvula is midline. Neck: No stridor.  Neck is supple full range of motion Hematological/Lymphatic/Immunilogical: No cervical lymphadenopathy. Cardiovascular: Normal rate, regular rhythm. Normal S1 and S2.  Good peripheral circulation. Respiratory: Normal respiratory effort without tachypnea or retractions. Lungs with scattered expiratory wheezes.  No rales or rhonchi.Peri [place]. Good air entry to the bases with no decreased or absent breath sounds. Musculoskeletal: Full range of motion to all extremities. No gross deformities appreciated. Neurologic:  Normal speech and language. No gross focal neurologic deficits are appreciated.  Skin:  Skin is warm, dry and intact. No rash noted. Psychiatric: Mood and affect are normal. Speech and behavior are normal. Patient exhibits appropriate insight and judgement.   ____________________________________________   LABS (all labs ordered are listed, but only abnormal results are displayed)  Labs Reviewed - No data to display ____________________________________________  EKG   ____________________________________________  RADIOLOGY I personally viewed and evaluated these images as part of my medical decision making, as well as reviewing the written report by the radiologist.  Dg Chest 2 View  Result Date: 03/10/2018 CLINICAL DATA:  43 year old female with cough for 5 days. EXAM:  CHEST - 2 VIEW COMPARISON:  None. FINDINGS: Somewhat low lung volumes on the lateral. Normal cardiac size and mediastinal contours. Visualized tracheal air column is within normal limits. No pneumothorax, pulmonary edema, pleural effusion or confluent pulmonary opacity. No acute osseous abnormality identified. Negative visible bowel gas pattern. IMPRESSION: No cardiopulmonary abnormality. Electronically Signed   By: Odessa FlemingH  Hall M.D.   On: 03/10/2018 20:33    ____________________________________________    PROCEDURES  Procedure(s) performed:    Procedures    Medications  predniSONE (DELTASONE) tablet 60 mg (has no administration in time range)     ____________________________________________   INITIAL IMPRESSION / ASSESSMENT AND PLAN / ED COURSE  Pertinent labs & imaging results that were available during my care of the patient were reviewed by me and considered in my medical decision making (see chart for details).  Review of the [place] CSRS was performed in accordance of the [facility] prior to dispensing any controlled drugs.      Patient's diagnosis is consistent with bronchitis.  Patient presents emergency department complaining of nasal congestion, sore throat, cough.  Chest x-ray with no consolidation concerning for pneumonia.  Differential  included viral URI, influenza, bronchitis, pneumonia.  Exam is consistent with viral bronchitis.  Patient is to continue over-the-counter medications and I will prescribe prednisone, albuterol inhaler, Bromfed cough syrup.  Patient will follow-up with primary care as needed. Patient is given ED precautions to return to the ED for any worsening or new symptoms.     ____________________________________________  FINAL CLINICAL IMPRESSION(S) / ED DIAGNOSES  Final diagnoses:  Acute bronchitis, unspecified organism      NEW MEDICATIONS STARTED DURING THIS VISIT:  ED Discharge Orders         Ordered    predniSONE (DELTASONE) 50 MG tablet  Daily  with breakfast     03/10/18 2125    albuterol (PROVENTIL HFA;VENTOLIN HFA) 108 (90 Base) MCG/ACT inhaler  Every 4 hours PRN     03/10/18 2125    brompheniramine-pseudoephedrine-DM 30-2-10 MG/5ML syrup  4 times daily PRN     03/10/18 2125              This chart was dictated using voice recognition software/Dragon. Despite best efforts to proofread, errors can occur which can change the meaning. Any change was purely unintentional.    Racheal PatchesCuthriell, Jonathan D, PA-C 03/10/18 2125    Willy Eddyobinson, Patrick, MD 03/10/18 [phone]

## 2018-03-10 NOTE — ED Triage Notes (Signed)
Pt comes via POV from home with c/o sore throat and cough. Pt states this started last Friday.  Pt states she has taken medications and it hasn't helped.  Pt states productive cough.

## 2018-12-28 ENCOUNTER — Ambulatory Visit
Admission: EM | Admit: 2018-12-28 | Discharge: 2018-12-28 | Disposition: A | Discharge status: Home / Self Care | Payer: Worker's Compensation | Attending: Family Medicine | Admitting: Family Medicine

## 2018-12-28 ENCOUNTER — Encounter: Payer: Self-pay | Admitting: Emergency Medicine

## 2018-12-28 ENCOUNTER — Other Ambulatory Visit: Payer: Self-pay

## 2018-12-28 DIAGNOSIS — X500XXA Overexertion from strenuous movement or load, initial encounter: Secondary | ICD-10-CM

## 2018-12-28 DIAGNOSIS — S46811A Strain of other muscles, fascia and tendons at shoulder and upper arm level, right arm, initial encounter: Secondary | ICD-10-CM | POA: Diagnosis not present

## 2018-12-28 MED ORDER — MELOXICAM 15 MG PO TABS: 15.0000 mg | ORAL_TABLET | Freq: Every day | ORAL | 0 refills | Status: DC | PRN

## 2018-12-28 MED ORDER — TIZANIDINE HCL 4 MG PO TABS: 4.0000 mg | ORAL_TABLET | Freq: Three times a day (TID) | ORAL | 0 refills | Status: DC | PRN

## 2018-12-28 NOTE — ED Triage Notes (Signed)
Patient c/o right shoulder pain that started yesterday. She states she was working picking up some boxes and she started having pain.

## 2018-12-28 NOTE — ED Provider Notes (Signed)
[facility]    CSN: [id number] Arrival date & time: 12/28/18  1521      History   Chief Complaint Chief Complaint  Patient presents with  . Shoulder Pain    DOI 12/27/2018   HPI  43 year old female presents with right shoulder pain.  This is a [employer], [employer]. claim.  Patient reports that she developed right shoulder pain yesterday.  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  She localizes the pain to the right trapezius region.  She states that her pain is 10/10 in severity.  She has not taken any medications for her pain.  Exacerbated with activity.  No relieving factors.  No other complaints.  PMH, Surgical Hx, Family Hx, Social History reviewed and updated as below.  Past Medical History:  Diagnosis Date  . Depression    Patient Active Problem List   Diagnosis Date Noted  . Chronic venous insufficiency 12/14/2017  . Lymphedema 12/14/2017  . Varicose veins of both lower extremities with pain 09/21/2017  . Bilateral lower extremity edema 09/21/2017   Past Surgical History:  Procedure Laterality Date  . NO PAST SURGERIES     OB History   No obstetric history on file.    Home Medications    Prior to Admission medications   Medication Sig Start Date End Date Taking? Authorizing Provider  FLUoxetine (PROZAC) 10 MG capsule Take 10 mg by mouth daily.   Yes [provider]  meloxicam (MOBIC) 15 MG tablet Take 1 tablet (15 mg total) by mouth daily as needed for pain. 12/28/18   Tommie Sams, DO  tiZANidine (ZANAFLEX) 4 MG tablet Take 1 tablet (4 mg total) by mouth every 8 (eight) hours as needed for muscle spasms. 12/28/18   Tommie Sams, DO  albuterol (PROVENTIL HFA;VENTOLIN HFA) 108 (90 Base) MCG/ACT inhaler Inhale 2 puffs into the lungs every 4 (four) hours as needed for wheezing or shortness of breath. 03/10/18 12/28/18  Cuthriell, Delorise Royals, PA-C  ferrous sulfate 325 (65 FE) MG tablet Take 325 mg by mouth daily with breakfast.  12/28/18   [provider]  fluticasone (FLONASE) 50 MCG/ACT nasal spray Place into both nostrils daily.  12/28/18  [provider]  Loratadine 10 MG CAPS Take by mouth daily.  12/28/18  [provider]    Family History Family History  Problem Relation Age of Onset  . Varicose Veins Mother   . Cancer Mother     Social History Social History   Tobacco Use  . Smoking status: Never Smoker  . Smokeless tobacco: Never Used  Substance Use Topics  . Alcohol use: Not Currently  . Drug use: Not Currently     Allergies   Patient has no known allergies.   Review of Systems Review of Systems  Constitutional: Negative.   Musculoskeletal:       Right shoulder pain.   Physical Exam Triage Vital Signs ED Triage Vitals  Enc Vitals Group     BP 12/28/18 1543 116/81     Pulse Rate 12/28/18 1543 69     Resp 12/28/18 1543 18     Temp 12/28/18 1543 98.2 F (36.8 C)     Temp Source 12/28/18 1543 Oral     SpO2 12/28/18 1543 100 %     Weight 12/28/18 1539 130 lb (59 kg)     Height 12/28/18 1539 5\' 1"  (1.549 m)     Head Circumference --      Peak  Flow --      Pain Score 12/28/18 1539 10     Pain Loc --      Pain Edu? --      Excl. in [place]? --    Updated Vital Signs BP 116/81 (BP Location: Right Arm)   Pulse 69   Temp 98.2 F (36.8 C) (Oral)   Resp 18   Ht 5\' 1"  (1.549 m)   Wt 59 kg   LMP 12/26/2018   SpO2 100%   BMI 24.56 kg/m   Visual Acuity Right Eye Distance:   Left Eye Distance:   Bilateral Distance:    Right Eye Near:   Left Eye Near:    Bilateral Near:     Physical Exam Vitals signs and nursing note reviewed.  Constitutional:      General: She is not in acute distress.    Appearance: She is normal weight. She is not ill-appearing.  HENT:     Head: Normocephalic and atraumatic.  Eyes:     General:        Right eye: No discharge.        Left eye: No discharge.     Conjunctiva/sclera: Conjunctivae normal.  Cardiovascular:     Rate and  Rhythm: Normal rate and regular rhythm.  Pulmonary:     Effort: Pulmonary effort is normal.     Breath sounds: Normal breath sounds. No wheezing, rhonchi or rales.  Musculoskeletal:     Comments: Right shoulder -normal range of motion.  Normal rotator cuff strength.  Negative Hawkins.  Patient with tenderness over the trapezius muscle on the right side.  Spasm noted.  Neurological:     Mental Status: She is alert.  Psychiatric:        Mood and Affect: Mood normal.        Behavior: Behavior normal.    UC Treatments / Results  Labs (all labs ordered are listed, but only abnormal results are displayed) Labs Reviewed - No data to display  EKG   Radiology No results found.  Procedures Procedures (including critical care time)  Medications Ordered in UC Medications - No data to display  Initial Impression / Assessment and Plan / UC Course  I have reviewed the triage vital signs and the nursing notes.  Pertinent labs & imaging results that were available during my care of the patient were reviewed by me and considered in my medical decision making (see chart for details).    43 year old female presents with a trapezius muscle strain and subsequent spasm.  Mobic and Zanaflex as directed.  Workmen's Comp. form filled out.  May return to work on Friday.  Final Clinical Impressions(s) / UC Diagnoses   Final diagnoses:  Strain of right trapezius muscle, initial encounter     Discharge Instructions     [place]. Cal[place].  Bao o ducha caliente.  Medicacin segn las indicaciones.  Dr. Lacinda Axon   ED Prescriptions    Medication Sig Dispense Auth. Provider   meloxicam (MOBIC) 15 MG tablet Take 1 tablet (15 mg total) by mouth daily as needed for pain. 30 tablet Ortencia Askari G, DO   tiZANidine (ZANAFLEX) 4 MG tablet Take 1 tablet (4 mg total) by mouth every 8 (eight) hours as needed for muscle spasms. 30 tablet Coral Spikes, DO     PDMP not reviewed this encounter.   Coral Spikes, [place] 12/28/18 1636

## 2018-12-28 NOTE — Discharge Instructions (Signed)
Descanso. Calor.  Bao o ducha caliente.  Medicacin segn las indicaciones.  Dr. Lacinda Axon

## 2019-04-24 ENCOUNTER — Other Ambulatory Visit: Payer: Self-pay

## 2019-04-24 ENCOUNTER — Ambulatory Visit
Admission: EM | Admit: 2019-04-24 | Discharge: 2019-04-24 | Disposition: A | Discharge status: Home / Self Care | Payer: Worker's Compensation | Attending: Emergency Medicine | Admitting: Emergency Medicine

## 2019-04-24 DIAGNOSIS — W268XXA Contact with other sharp object(s), not elsewhere classified, initial encounter: Secondary | ICD-10-CM

## 2019-04-24 DIAGNOSIS — Z23 Encounter for immunization: Secondary | ICD-10-CM | POA: Diagnosis not present

## 2019-04-24 DIAGNOSIS — Z042 Encounter for examination and observation following work accident: Secondary | ICD-10-CM

## 2019-04-24 DIAGNOSIS — S0990XA Unspecified injury of head, initial encounter: Secondary | ICD-10-CM

## 2019-04-24 DIAGNOSIS — S0101XA Laceration without foreign body of scalp, initial encounter: Secondary | ICD-10-CM | POA: Diagnosis not present

## 2019-04-24 MED ORDER — TETANUS-DIPHTH-ACELL PERTUSSIS 5-2.5-18.5 LF-MCG/0.5 IM SUSP
0.5000 mL | Freq: Once | INTRAMUSCULAR | Status: AC
  Administered 2019-04-24: 0.5 mL via INTRAMUSCULAR

## 2019-04-24 MED ORDER — MUPIROCIN 2 % EX OINT: 1.0000 "application " | TOPICAL_OINTMENT | Freq: Three times a day (TID) | CUTANEOUS | 0 refills | Status: AC

## 2019-04-24 NOTE — ED Provider Notes (Signed)
[facility]    CSN: [id number] Arrival date & time: 04/24/19  1048      History   Chief Complaint Chief Complaint  Patient presents with  . Work Related Injury  . Head Injury    HPI Ruey Storer is a 44 y.o. female.   HPI translation services provided by [facility] #[id number] 44 year old Hispanic female presents today with a laceration to the parietal scalp on the left.  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  She did not think anything of it until there was noticed that she had some blood on her scalp.  She had no loss of consciousness but states she was dazed.  She has had no nausea or vomiting.  She states that the area directly under the laceration is tender.  She states that her last tetanus shot was over 10 years ago when she had her child.        Past Medical History:  Diagnosis Date  . Depression     Patient Active Problem List   Diagnosis Date Noted  . Chronic venous insufficiency 12/14/2017  . Lymphedema 12/14/2017  . Varicose veins of both lower extremities with pain 09/21/2017  . Bilateral lower extremity edema 09/21/2017    Past Surgical History:  Procedure Laterality Date  . NO PAST SURGERIES      OB History   No obstetric history on file.      Home Medications    Prior to Admission medications   Medication Sig Start Date End Date Taking? Authorizing Provider  FLUoxetine (PROZAC) 10 MG capsule Take 10 mg by mouth daily.   Yes [provider]  mupirocin ointment (BACTROBAN) 2 % Apply 1 application topically 3 (three) times daily. 04/24/19   Lutricia Feil, PA-C  albuterol (PROVENTIL HFA;VENTOLIN HFA) 108 (90 Base) MCG/ACT inhaler Inhale 2 puffs into the lungs every 4 (four) hours as needed for wheezing or shortness of breath. 03/10/18 12/28/18  Cuthriell, Delorise Royals, PA-C  ferrous sulfate 325 (65 FE) MG tablet Take 325 mg by mouth daily  with breakfast.  12/28/18  [provider]  fluticasone (FLONASE) 50 MCG/ACT nasal spray Place into both nostrils daily.  12/28/18  [provider]  Loratadine 10 MG CAPS Take by mouth daily.  12/28/18  [provider]    Family History Family History  Problem Relation Age of Onset  . Varicose Veins Mother   . Cancer Mother     Social History Social History   Tobacco Use  . Smoking status: Never Smoker  . Smokeless tobacco: Never Used  Substance Use Topics  . Alcohol use: Not Currently  . Drug use: Not Currently     Allergies   Patient has no known allergies.   Review of Systems Review of Systems  Constitutional: Positive for activity change. Negative for appetite change, chills, diaphoresis, fatigue and fever.  Skin: Positive for wound.  All other systems reviewed and are negative.    Physical Exam Triage Vital Signs ED Triage Vitals  Enc Vitals Group     BP 04/24/19 1110 111/75     Pulse Rate 04/24/19 1110 64     Resp 04/24/19 1110 18     Temp 04/24/19 1110 98 F (36.7 C)     Temp Source 04/24/19 1110 Oral     SpO2 04/24/19 1110 100 %     Weight 04/24/19  1106 137 lb (62.1 kg)     Height --      Head Circumference --      Peak Flow --      Pain Score 04/24/19 1106 6     Pain Loc --      Pain Edu? --      Excl. in [place]? --    No data found.  Updated Vital Signs BP 111/75 (BP Location: Left Arm)   Pulse 64   Temp 98 F (36.7 C) (Oral)   Resp 18   Wt 137 lb (62.1 kg)   LMP 04/21/2019   SpO2 100%   BMI 25.89 kg/m   Visual Acuity Right Eye Distance:   Left Eye Distance:   Bilateral Distance:    Right Eye Near:   Left Eye Near:    Bilateral Near:     Physical Exam Vitals and nursing note reviewed.  Constitutional:      General: She is not in acute distress.    Appearance: Normal appearance. She is normal weight. She is not ill-appearing or toxic-appearing.  HENT:     Head: Normocephalic.     Comments: Examination  of the head shows a small superficial laceration to the parietal scalp.  It extends into a depth of approximately half millimeter.  It is tender surrounding the area.  There is not cysts any significant bruising present.  There is no erythema.    Right Ear: Tympanic membrane and ear canal normal.     Left Ear: Tympanic membrane and ear canal normal.     Nose: Nose normal.     Mouth/Throat:     Mouth: Mucous membranes are moist.     Pharynx: Oropharynx is clear.  Eyes:     Extraocular Movements: Extraocular movements intact.     Conjunctiva/sclera: Conjunctivae normal.     Pupils: Pupils are equal, round, and reactive to light.  Musculoskeletal:        General: Normal range of motion.     Cervical back: Normal range of motion and neck supple. No rigidity or tenderness.  Skin:    General: Skin is warm and dry.     Findings: Erythema present.  Neurological:     General: No focal deficit present.     Mental Status: She is alert and oriented to person, place, and time.     Cranial Nerves: No cranial nerve deficit.  Psychiatric:        Mood and Affect: Mood normal.        Behavior: Behavior normal.        Thought Content: Thought content normal.        Judgment: Judgment normal.        UC Treatments / Results  Labs (all labs ordered are listed, but only abnormal results are displayed) Labs Reviewed - No data to display  EKG   Radiology No results found.  Procedures Procedures (including critical care time)  Medications Ordered in UC Medications  Tdap (BOOSTRIX) injection 0.5 mL (0.5 mLs Intramuscular Given 04/24/19 1154)    Initial Impression / Assessment and Plan / UC Course  I have reviewed the triage vital signs and the nursing notes.  Pertinent labs & imaging results that were available during my care of the patient were reviewed by me and considered in my medical decision making (see chart for details).   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  At first she was a little dazed but did not have any loss of consciousness.  She states that that she didn't think much of the incident until coworker noticed that she was bleeding.  Her supervisor sent her here for evaluation.  Is had no nausea or vomiting.  She has a mild headache.  Positive for discomfort centered around the small laceration.  Laceration is very small measuring approximately 7 mm in length and half millimeter in depth.  Bleeding is already been controlled.  Through an online interpreter it was explained to her that there is no need for sutures or staples.  We have decided not to glue the area.  Instead she will keep the area clean and place mupirocin ointment on the area 3 times daily.  She may return to work with no restrictions tomorrow.  If she notices any signs or symptoms of infection she'll return to our clinic.     Final Clinical Impressions(s) / UC Diagnoses   Final diagnoses:  Laceration of scalp, initial encounter  Minor head injury, initial encounter   Discharge Instructions   None    ED Prescriptions    Medication Sig Dispense Auth. Provider   mupirocin ointment (BACTROBAN) 2 % Apply 1 application topically 3 (three) times daily. 22 g Lorin Picket, PA-C     PDMP not reviewed this encounter.   Lorin Picket, PA-C 04/24/19 1557

## 2019-04-24 NOTE — ED Triage Notes (Addendum)
Patient states that she was at work this morning. 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 Patient with small break in her scalp and some swelling. States that the area has been bleeding.

## 2019-06-06 DIAGNOSIS — M7541 Impingement syndrome of right shoulder: Secondary | ICD-10-CM | POA: Insufficient documentation

## 2019-07-15 ENCOUNTER — Ambulatory Visit: Payer: Self-pay | Attending: Internal Medicine

## 2020-01-03 ENCOUNTER — Ambulatory Visit (INDEPENDENT_AMBULATORY_CARE_PROVIDER_SITE_OTHER): Payer: Medicaid Other | Admitting: Nurse Practitioner

## 2020-01-03 ENCOUNTER — Encounter (INDEPENDENT_AMBULATORY_CARE_PROVIDER_SITE_OTHER): Payer: Self-pay | Admitting: Nurse Practitioner

## 2020-01-03 ENCOUNTER — Other Ambulatory Visit: Payer: Self-pay

## 2020-01-03 VITALS — BP 124/80 | HR 72 | Ht 62.0 in | Wt 139.0 lb

## 2020-01-03 DIAGNOSIS — I872 Venous insufficiency (chronic) (peripheral): Secondary | ICD-10-CM

## 2020-01-03 DIAGNOSIS — I83813 Varicose veins of bilateral lower extremities with pain: Secondary | ICD-10-CM

## 2020-01-03 MED ORDER — IBUPROFEN 800 MG PO TABS: 800.0000 mg | ORAL_TABLET | Freq: Three times a day (TID) | ORAL | 4 refills | Status: AC | PRN

## 2020-01-08 ENCOUNTER — Encounter (INDEPENDENT_AMBULATORY_CARE_PROVIDER_SITE_OTHER): Payer: Self-pay | Admitting: Nurse Practitioner

## 2020-01-08 NOTE — Progress Notes (Signed)
Subjective:    Patient ID: Alicia Camacho, female    DOB: 1976/01/03, 44 y.o.   MRN: [id number] Chief Complaint  Patient presents with  . Follow-up    [place]. recurrence of symptoms post 2019 ablation     The patient is seen for evaluation of symptomatic varicose veins in her left lower extremity. The patient relates burning and stinging which worsened steadily throughout the course of the day, particularly with standing. The patient also notes an aching and throbbing pain over the varicosities, particularly with prolonged dependent positions. The symptoms are significantly improved with elevation.  The patient also notes that during hot weather the symptoms are greatly intensified. The patient states the pain from the varicose veins interferes with work, daily exercise, shopping and household maintenance. At this point, the symptoms are persistent and severe enough that they're having a negative impact on lifestyle and are interfering with daily activities.  There is no history of DVT, PE or superficial thrombophlebitis. There is no history of ulceration or hemorrhage. The patient denies a significant family history of varicose veins.  The patient has a previous history of endovenous laser ablation of her right great saphenous vein and has continued conservative therapy tactics since that time. The endovenous ablation was done in 2019.   Review of Systems  Cardiovascular: Positive for leg swelling.  Hematological: Bruises/bleeds easily.  All other systems reviewed and are negative.      Objective:   Physical Exam Vitals reviewed.  HENT:     Head: Normocephalic.  Cardiovascular:     Rate and Rhythm: Normal rate.     Pulses: Normal pulses.     Comments: Large varicosity along Lateral side of LLE Pulmonary:     Effort: Pulmonary effort is normal.  Neurological:     Mental Status: She is alert and oriented to person, place, and time.  Psychiatric:        Mood and Affect: Mood  normal.        Behavior: Behavior normal.        Thought Content: Thought content normal.        Judgment: Judgment normal.     BP 124/80   Pulse 72   Ht 5\' 2"  (1.575 m)   Wt 139 lb (63 kg)   BMI 25.42 kg/m   Past Medical History:  Diagnosis Date  . Depression     Social History   Socioeconomic History  . Marital status: Married    Spouse name: Not on file  . Number of children: Not on file  . Years of education: Not on file  . Highest education level: Not on file  Occupational History  . Not on file  Tobacco Use  . Smoking status: Never Smoker  . Smokeless tobacco: Never Used  Vaping Use  . Vaping Use: Never used  Substance and Sexual Activity  . Alcohol use: Not Currently  . Drug use: Not Currently  . Sexual activity: Not on file  Other Topics Concern  . Not on file  Social History Narrative  . Not on file   Social Determinants of Health   Financial Resource Strain:   . Difficulty of Paying Living Expenses: Not on file  Food Insecurity:   . Worried About in the Last Year: Not on file  . Ran Out of Food in the Last Year: Not on file  Transportation Needs:   . Lack of Transportation (Medical): Not on file  . Lack of Transportation (  Non-Medical): Not on file  Physical Activity:   . Days of Exercise per Week: Not on file  . Minutes of Exercise per Session: Not on file  Stress:   . Feeling of Stress : Not on file  Social Connections:   . Frequency of Communication with Friends and Family: Not on file  . Frequency of Social Gatherings with Friends and Family: Not on file  . Attends Religious Services: Not on file  . Active Member of Clubs or Organizations: Not on file  . Attends Banker Meetings: Not on file  . Marital Status: Not on file  Intimate Partner Violence:   . Fear of Current or Ex-Partner: Not on file  . Emotionally Abused: Not on file  . Physically Abused: Not on file  . Sexually Abused: Not on file     Past Surgical History:  Procedure Laterality Date  . NO PAST SURGERIES      Family History  Problem Relation Age of Onset  . Varicose Veins Mother   . Cancer Mother     No Known Allergies  CBC Latest Ref Rng & Units 05/21/2017  WBC 3.6 - 11.0 K/uL 6.9  Hemoglobin 12.0 - 16.0 g/dL 13.2  Hematocrit 35 - 47 % 37.2  Platelets 150 - 440 K/uL 234      CMP     Component Value Date/Time   NA 139 05/21/2017 0743   K 3.9 05/21/2017 0743   CL 108 05/21/2017 0743   CO2 26 05/21/2017 0743   GLUCOSE 93 05/21/2017 0743   BUN 22 (H) 05/21/2017 0743   CREATININE 0.80 05/21/2017 0743   CALCIUM 8.5 (L) 05/21/2017 0743   PROT 6.8 05/21/2017 0743   ALBUMIN 3.4 (L) 05/21/2017 0743   AST 22 05/21/2017 0743   ALT 21 05/21/2017 0743   ALKPHOS 81 05/21/2017 0743   BILITOT 0.5 05/21/2017 0743   GFRNONAA >60 05/21/2017 0743   GFRAA >60 05/21/2017 0743     No results found.     Assessment & Plan:   1. Varicose veins of both lower extremities with pain  Recommend:  The patient has large symptomatic varicose veins that are painful and associated with swelling.  I have had a long discussion with the patient regarding  varicose veins and why they cause symptoms.  Patient will continue wearing graduated compression stockings class 1 on a daily basis, beginning first thing in the morning and removing them in the evening. The patient is instructed specifically not to sleep in the stockings.    The patient  will also continue using over-the-counter analgesics such as Motrin 800 mg po TID to help control the symptoms.    In addition, behavioral modification including elevation during the day will be continued.       An  ultrasound of the venous system will be obtained.   Further plans will be based on the ultrasound results  2. Chronic venous insufficiency No surgery or intervention at this point in time.    I have had a long discussion with the patient regarding venous  insufficiency and why it  causes symptoms. I have discussed with the patient the chronic skin changes that accompany venous insufficiency and the long term sequela such as infection and ulceration.  Patient will begin wearing graduated compression stockings class 1 (20-30 mmHg) or compression wraps on a daily basis a prescription was given. The patient will put the stockings on first thing in the morning and removing them in the evening.  The patient is instructed specifically not to sleep in the stockings.    In addition, behavioral modification including several periods of elevation of the lower extremities during the day will be continued. I have demonstrated that proper elevation is a position with the ankles at heart level.  The patient is instructed to begin routine exercise, especially walking on a daily basis     Current Outpatient Medications on File Prior to Visit  Medication Sig Dispense Refill  . FLUoxetine (PROZAC) 10 MG capsule Take 10 mg by mouth daily.    . mupirocin ointment (BACTROBAN) 2 % Apply 1 application topically 3 (three) times daily. 22 g 0  . [DISCONTINUED] albuterol (PROVENTIL HFA;VENTOLIN HFA) 108 (90 Base) MCG/ACT inhaler Inhale 2 puffs into the lungs every 4 (four) hours as needed for wheezing or shortness of breath. 1 Inhaler 0  . [DISCONTINUED] ferrous sulfate 325 (65 FE) MG tablet Take 325 mg by mouth daily with breakfast.    . [DISCONTINUED] fluticasone (FLONASE) 50 MCG/ACT nasal spray Place into both nostrils daily.    . [DISCONTINUED] Loratadine 10 MG CAPS Take by mouth daily.     No current facility-administered medications on file prior to visit.    There are no Patient Instructions on file for this visit. No follow-ups on file.   Georgiana Spinner, NP

## 2020-01-10 ENCOUNTER — Other Ambulatory Visit (INDEPENDENT_AMBULATORY_CARE_PROVIDER_SITE_OTHER): Payer: Self-pay | Admitting: Vascular Surgery

## 2020-01-10 DIAGNOSIS — I83812 Varicose veins of left lower extremities with pain: Secondary | ICD-10-CM

## 2020-01-12 ENCOUNTER — Ambulatory Visit (INDEPENDENT_AMBULATORY_CARE_PROVIDER_SITE_OTHER): Payer: Medicaid Other | Admitting: Nurse Practitioner

## 2020-01-12 ENCOUNTER — Ambulatory Visit (INDEPENDENT_AMBULATORY_CARE_PROVIDER_SITE_OTHER): Payer: Medicaid Other

## 2020-01-12 ENCOUNTER — Other Ambulatory Visit: Payer: Self-pay

## 2020-01-12 ENCOUNTER — Encounter (INDEPENDENT_AMBULATORY_CARE_PROVIDER_SITE_OTHER): Payer: Self-pay

## 2020-02-08 ENCOUNTER — Encounter (INDEPENDENT_AMBULATORY_CARE_PROVIDER_SITE_OTHER): Payer: Self-pay

## 2020-02-08 ENCOUNTER — Other Ambulatory Visit: Payer: Self-pay

## 2020-02-08 ENCOUNTER — Ambulatory Visit (INDEPENDENT_AMBULATORY_CARE_PROVIDER_SITE_OTHER): Payer: Medicaid Other

## 2020-02-08 ENCOUNTER — Encounter (INDEPENDENT_AMBULATORY_CARE_PROVIDER_SITE_OTHER): Payer: Self-pay | Admitting: Nurse Practitioner

## 2020-02-08 ENCOUNTER — Ambulatory Visit (INDEPENDENT_AMBULATORY_CARE_PROVIDER_SITE_OTHER): Payer: Medicaid Other | Admitting: Nurse Practitioner

## 2020-02-08 VITALS — BP 118/72 | HR 69 | Ht 64.0 in | Wt 142.0 lb

## 2020-02-08 DIAGNOSIS — I890 Lymphedema, not elsewhere classified: Secondary | ICD-10-CM | POA: Diagnosis not present

## 2020-02-08 DIAGNOSIS — I83812 Varicose veins of left lower extremities with pain: Secondary | ICD-10-CM

## 2020-02-08 DIAGNOSIS — I83813 Varicose veins of bilateral lower extremities with pain: Secondary | ICD-10-CM | POA: Diagnosis not present

## 2020-02-15 ENCOUNTER — Encounter (INDEPENDENT_AMBULATORY_CARE_PROVIDER_SITE_OTHER): Payer: Self-pay | Admitting: Nurse Practitioner

## 2020-02-15 NOTE — Progress Notes (Signed)
Subjective:    Patient ID: Jaidah Lomax, female    DOB: February 27, 1975, 44 y.o.   MRN: [id number] Chief Complaint  Patient presents with  . Follow-up    U/S     The patient returns for followup evaluation  after the initial visit. The patient continues to have pain in the lower extremities with dependency.  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  The pain is somewhat lessened with elevation. Graduated compression stockings, Class I (20-30 mmHg), have been worn but the stockings do not eliminate the leg pain. Over-the-counter analgesics do not improve the symptoms. The degree of discomfort continues to interfere with daily activities. The patient notes the pain in the legs is causing problems with daily exercise, at the workplace and even with household activities and maintenance such as standing in the kitchen preparing meals and doing dishes.   Venous ultrasound shows normal deep venous system, no evidence of acute or chronic DVT.  No superficial venous reflux is present in the left lower extremity.  No deep venous insufficiency is present.   Review of Systems  Cardiovascular: Positive for leg swelling.  All other systems reviewed and are negative.      Objective:   Physical Exam Vitals reviewed.  HENT:     Head: Normocephalic.  Cardiovascular:     Rate and Rhythm: Normal rate.     Pulses: Normal pulses.  Pulmonary:     Effort: Pulmonary effort is normal.  Musculoskeletal:     Right lower leg: Edema present.     Left lower leg: Edema present.  Neurological:     Mental Status: She is alert and oriented to person, place, and time.  Psychiatric:        Mood and Affect: Mood normal.        Behavior: Behavior normal.        Thought Content: Thought content normal.        Judgment: Judgment normal.     BP 118/72   Pulse 69   Ht 5\' 4"  (1.626 m)   Wt 142 lb (64.4 kg)   BMI 24.37 kg/m   Past Medical History:   Diagnosis Date  . Depression     Social History   Socioeconomic History  . Marital status: Married    Spouse name: Not on file  . Number of children: Not on file  . Years of education: Not on file  . Highest education level: Not on file  Occupational History  . Not on file  Tobacco Use  . Smoking status: Never Smoker  . Smokeless tobacco: Never Used  Vaping Use  . Vaping Use: Never used  Substance and Sexual Activity  . Alcohol use: Not Currently  . Drug use: Not Currently  . Sexual activity: Not on file  Other Topics Concern  . Not on file  Social History Narrative  . Not on file   Social Determinants of Health   Financial Resource Strain: Not on file  Food Insecurity: Not on file  Transportation Needs: Not on file  Physical Activity: Not on file  Stress: Not on file  Social Connections: Not on file  Intimate Partner Violence: Not on file    Past Surgical History:  Procedure Laterality Date  . NO PAST SURGERIES      Family History  Problem Relation Age of Onset  . Varicose Veins Mother   . Cancer Mother  No Known Allergies  CBC Latest Ref Rng & Units 05/21/2017  WBC 3.6 - 11.0 K/uL 6.9  Hemoglobin 12.0 - 16.0 g/dL 49.7  Hematocrit 02.6 - 47.0 % 37.2  Platelets 150 - 440 K/uL 234      CMP     Component Value Date/Time   NA 139 05/21/2017 0743   K 3.9 05/21/2017 0743   CL 108 05/21/2017 0743   CO2 26 05/21/2017 0743   GLUCOSE 93 05/21/2017 0743   BUN 22 (H) 05/21/2017 0743   CREATININE 0.80 05/21/2017 0743   CALCIUM 8.5 (L) 05/21/2017 0743   PROT 6.8 05/21/2017 0743   ALBUMIN 3.4 (L) 05/21/2017 0743   AST 22 05/21/2017 0743   ALT 21 05/21/2017 0743   ALKPHOS 81 05/21/2017 0743   BILITOT 0.5 05/21/2017 0743   GFRNONAA >60 05/21/2017 0743   GFRAA >60 05/21/2017 0743     No results found.     Assessment & Plan:   1. Varicose veins of both lower extremities with pain Currently today's noninvasive studies do not indicate any  evidence of reflux in her left lower extremity.  He has there is no present evidence of reflux any intervention would be likely considered cosmetic.  Patient is advised to continue with conservative therapy including elevation, wearing medical grade 1 compression stockings and exercise.  Further work-up of lower extremity pain is deferred to primary care provider.  2. Lymphedema No surgery or intervention at this point in time.  I have reviewed my discussion with the patient regarding venous insufficiency and why it causes symptoms. I have discussed with the patient the chronic skin changes that accompany venous insufficiency and the long term sequela such as ulceration. Patient will contnue wearing graduated compression stockings on a daily basis, as this has provided excellent control of his edema. The patient will put the stockings on first thing in the morning and removing them in the evening. The patient is reminded not to sleep in the stockings.  In addition, behavioral modification including elevation during the day will be initiated. Exercise is strongly encouraged.   Given the patient's good control and lack of any problems regarding the venous insufficiency and lymphedema a lymph pump in not need at this time.  The patient will follow up with me PRN should anything change.  The patient voices agreement with this plan.    Current Outpatient Medications on File Prior to Visit  Medication Sig Dispense Refill  . ibuprofen (ADVIL) 800 MG tablet Take 1 tablet (800 mg total) by mouth every 8 (eight) hours as needed. 30 tablet 4  . mupirocin ointment (BACTROBAN) 2 % Apply 1 application topically 3 (three) times daily. 22 g 0  . norethindrone (MICRONOR) 0.35 MG tablet Take 1 tablet by mouth daily.    Marland Kitchen FLUoxetine (PROZAC) 10 MG capsule Take 10 mg by mouth daily.    . [DISCONTINUED] albuterol (PROVENTIL HFA;VENTOLIN HFA) 108 (90 Base) MCG/ACT inhaler Inhale 2 puffs into the lungs every 4 (four)  hours as needed for wheezing or shortness of breath. 1 Inhaler 0  . [DISCONTINUED] ferrous sulfate 325 (65 FE) MG tablet Take 325 mg by mouth daily with breakfast.    . [DISCONTINUED] fluticasone (FLONASE) 50 MCG/ACT nasal spray Place into both nostrils daily.    . [DISCONTINUED] Loratadine 10 MG CAPS Take by mouth daily.     No current facility-administered medications on file prior to visit.    There are no Patient Instructions on file for this visit.  No follow-ups on file.   Kris Hartmann, NP

## 2020-07-17 ENCOUNTER — Emergency Department: Payer: Medicaid Other

## 2020-07-17 ENCOUNTER — Other Ambulatory Visit: Payer: Self-pay

## 2020-07-17 ENCOUNTER — Emergency Department
Admission: EM | Admit: 2020-07-17 | Discharge: 2020-07-17 | Disposition: A | Discharge status: Home / Self Care | Payer: Medicaid Other | Attending: Emergency Medicine | Admitting: Emergency Medicine

## 2020-07-17 ENCOUNTER — Encounter: Payer: Self-pay | Admitting: Emergency Medicine

## 2020-07-17 DIAGNOSIS — R0602 Shortness of breath: Secondary | ICD-10-CM

## 2020-07-17 DIAGNOSIS — R079 Chest pain, unspecified: Secondary | ICD-10-CM | POA: Diagnosis present

## 2020-07-17 LAB — BASIC METABOLIC PANEL
Anion gap: 8 (ref 5–15)
BUN: 18 mg/dL (ref 6–20)
CO2: 21 mmol/L — ABNORMAL LOW (ref 22–32)
Calcium: 8.4 mg/dL — ABNORMAL LOW (ref 8.9–10.3)
Chloride: 107 mmol/L (ref 98–111)
Creatinine, Ser: 0.72 mg/dL (ref 0.44–1.00)
GFR, Estimated: >60 mL/min (ref >60)
Glucose, Bld: 104 mg/dL — ABNORMAL HIGH (ref 70–99)
Potassium: 3.9 mmol/L (ref 3.5–5.1)
Sodium: 136 mmol/L (ref 135–145)

## 2020-07-17 LAB — CBC
HCT: 30.9 % — ABNORMAL LOW (ref 36.0–46.0)
Hemoglobin: 9.6 g/dL — ABNORMAL LOW (ref 12.0–15.0)
MCH: 23.3 pg — ABNORMAL LOW (ref 26.0–34.0)
MCHC: 31.1 g/dL (ref 30.0–36.0)
MCV: 75 fL — ABNORMAL LOW (ref 80.0–100.0)
Platelets: 338 10*3/uL (ref 150–400)
RBC: 4.12 MIL/uL (ref 3.87–5.11)
RDW: 18.1 % — ABNORMAL HIGH (ref 11.5–15.5)
WBC: 8 10*3/uL (ref 4.0–10.5)
nRBC: 0 % (ref 0.0–0.2)

## 2020-07-17 LAB — TROPONIN I (HIGH SENSITIVITY)
Troponin I (High Sensitivity): 3 ng/L (ref <18)
Troponin I (High Sensitivity): 3 ng/L (ref <18)

## 2020-07-17 NOTE — ED Triage Notes (Addendum)
States started to feel bad at work today and my boss said that I was shaking and having difficulty breathing.  States feels better now, but still c/o shaking to body and hand.s  C/O SOB, chest pressure and mid back pain with inspiration.  AAOx3.  Skin warm and dry. NAD

## 2020-07-17 NOTE — ED Provider Notes (Signed)
[facility] Emergency Department Provider Note   ____________________________________________   Event Date/Time   First MD Initiated Contact with Patient 07/17/20 1227     (approximate)  I have reviewed the triage vital signs and the nursing notes.   HISTORY  Chief Complaint Chest Pain, Shortness of Breath   HPI Alicia Camacho is a 45 y.o. female with past medical history of depression and varicose veins who presents to the ED complaining of chest pain and shortness of breath.  History is limited as patient is Spanish-speaking only, history obtained via in person interpreter.  Patient states she was at work earlier today when she started to feel very shaky with difficulty catching her breath.  This was associated with pressure in her chest and her mid back.  Since arriving to the ED, her symptoms has much improved and she is no longer having any pain in her chest or difficulty breathing.  She has been feeling well recently with no fevers, cough, pain or swelling in her legs.  She has never had similar symptoms in the past, denies any cardiac history.        Past Medical History:  Diagnosis Date  . Depression     Patient Active Problem List   Diagnosis Date Noted  . Impingement syndrome of right shoulder region 06/06/2019  . Chronic venous insufficiency 12/14/2017  . Lymphedema 12/14/2017  . Varicose veins of both lower extremities with pain 09/21/2017  . Bilateral lower extremity edema 09/21/2017    Past Surgical History:  Procedure Laterality Date  . NO PAST SURGERIES      Prior to Admission medications   Medication Sig Start Date End Date Taking? Authorizing Provider  FLUoxetine (PROZAC) 10 MG capsule Take 10 mg by mouth daily.    [provider]  ibuprofen (ADVIL) 800 MG tablet Take 1 tablet (800 mg total) by mouth every 8 (eight) hours as needed. 01/03/20   Georgiana Spinner, NP  mupirocin ointment (BACTROBAN) 2 % Apply 1 application  topically 3 (three) times daily. 04/24/19   Lutricia Feil, PA-C  norethindrone (MICRONOR) 0.35 MG tablet Take 1 tablet by mouth daily. 01/26/20   [provider]  albuterol (PROVENTIL HFA;VENTOLIN HFA) 108 (90 Base) MCG/ACT inhaler Inhale 2 puffs into the lungs every 4 (four) hours as needed for wheezing or shortness of breath. 03/10/18 12/28/18  Cuthriell, Delorise Royals, PA-C  ferrous sulfate 325 (65 FE) MG tablet Take 325 mg by mouth daily with breakfast.  12/28/18  [provider]  fluticasone (FLONASE) 50 MCG/ACT nasal spray Place into both nostrils daily.  12/28/18  [provider]  Loratadine 10 MG CAPS Take by mouth daily.  12/28/18  [provider]    Allergies Patient has no known allergies.  Family History  Problem Relation Age of Onset  . Varicose Veins Mother   . Cancer Mother     Social History Social History   Tobacco Use  . Smoking status: Never Smoker  . Smokeless tobacco: Never Used  Vaping Use  . Vaping Use: Never used  Substance Use Topics  . Alcohol use: Not Currently  . Drug use: Not Currently    Review of Systems  Constitutional: No fever/chills Eyes: No visual changes. ENT: No sore throat. Cardiovascular: Positive for chest pain. Respiratory: Positive for shortness of breath. Gastrointestinal: No abdominal pain.  No nausea, no vomiting.  No diarrhea.  No constipation. Genitourinary: Negative for dysuria. Musculoskeletal: Negative for back pain. Skin: Negative for  rash. Neurological: Negative for headaches, focal weakness or numbness.  ____________________________________________   PHYSICAL EXAM:  VITAL SIGNS: ED Triage Vitals  Enc Vitals Group     BP 07/17/20 1145 133/82     Pulse Rate 07/17/20 1145 72     Resp 07/17/20 1145 16     Temp 07/17/20 1144 99.5 F (37.5 C)     Temp Source 07/17/20 1144 Oral     SpO2 07/17/20 1145 98 %     Weight 07/17/20 1142 141 lb 15.6 oz (64.4 kg)     Height 07/17/20 1142 5'  4" (1.626 m)     Head Circumference --      Peak Flow --      Pain Score 07/17/20 1142 0     Pain Loc --      Pain Edu? --      Excl. in GC? --     Constitutional: Alert and oriented. Eyes: Conjunctivae are normal. Head: Atraumatic. Nose: No congestion/rhinnorhea. Mouth/Throat: Mucous membranes are moist. Neck: Normal ROM Cardiovascular: Normal rate, regular rhythm. Grossly normal heart sounds.  2+ radial pulses bilaterally. Respiratory: Normal respiratory effort.  No retractions. Lungs CTAB.  No chest wall tenderness to palpation. Gastrointestinal: Soft and nontender. No distention. Genitourinary: deferred Musculoskeletal: No lower extremity tenderness nor edema. Neurologic:  Normal speech and language. No gross focal neurologic deficits are appreciated. Skin:  Skin is warm, dry and intact. No rash noted. Psychiatric: Mood and affect are normal. Speech and behavior are normal.  ____________________________________________   LABS (all labs ordered are listed, but only abnormal results are displayed)  Labs Reviewed  BASIC METABOLIC PANEL - Abnormal; Notable for the following components:      Result Value   CO2 21 (*)    Glucose, Bld 104 (*)    Calcium 8.4 (*)    All other components within normal limits  CBC - Abnormal; Notable for the following components:   Hemoglobin 9.6 (*)    HCT 30.9 (*)    MCV 75.0 (*)    MCH 23.3 (*)    RDW 18.1 (*)    All other components within normal limits  POC URINE PREG, ED  TROPONIN I (HIGH SENSITIVITY)  TROPONIN I (HIGH SENSITIVITY)   ____________________________________________  EKG  ED ECG REPORT I, Chesley Noon, the attending physician, personally viewed and interpreted this ECG.   Date: 07/17/2020  EKG Time: 11:39  Rate: 77  Rhythm: normal sinus rhythm  Axis: Normal  Intervals:none  ST&T Change: None   PROCEDURES  Procedure(s) performed (including Critical  Care):  Procedures   ____________________________________________   INITIAL IMPRESSION / ASSESSMENT AND PLAN / ED COURSE       45 year old female with past medical history of depression and varicose veins who presents to the ED with episode of feeling shaky, difficulty catching her breath, and pain in her chest and back.  Symptoms are now much improved and patient denies any ongoing chest pain or shortness of breath.  EKG shows no evidence of arrhythmia or ischemia and symptoms sound atypical for ACS.  Initial troponin is negative, chest x-ray reviewed by me and shows no infiltrate, edema, or effusion.  I doubt PE as she is PERC negative.  We will repeat troponin, but if this is within normal limits patient will be appropriate for discharge home with PCP follow-up given her heart score of less than 4.  Repeat troponin within normal limits, patient remains chest pain-free at this time and her breathing is back  to normal.  Given unremarkable work-up, she is appropriate for discharge home with PCP follow-up, was counseled to return to the ED for new or worsening symptoms.  Patient agrees with plan.      ____________________________________________   FINAL CLINICAL IMPRESSION(S) / ED DIAGNOSES  Final diagnoses:  Chest pain, unspecified type  Shortness of breath     ED Discharge Orders    None       Note:  This document was prepared using Dragon voice recognition software and may include unintentional dictation errors.   Chesley Noon, MD 07/17/20 [phone]

## 2021-12-05 ENCOUNTER — Encounter: Payer: Self-pay | Admitting: Family Medicine

## 2022-02-13 IMAGING — CR DG CHEST 2V
1 series · 2 of 2 positions shown · non-contrast
Comparison: 03/10/2018

CLINICAL DATA: Shortness of breath

EXAM:
CHEST - 2 VIEW

[Series 1: dg chest 2 view · 0.14mm/px · 2 of 2 slices shown]
[im 1/2]
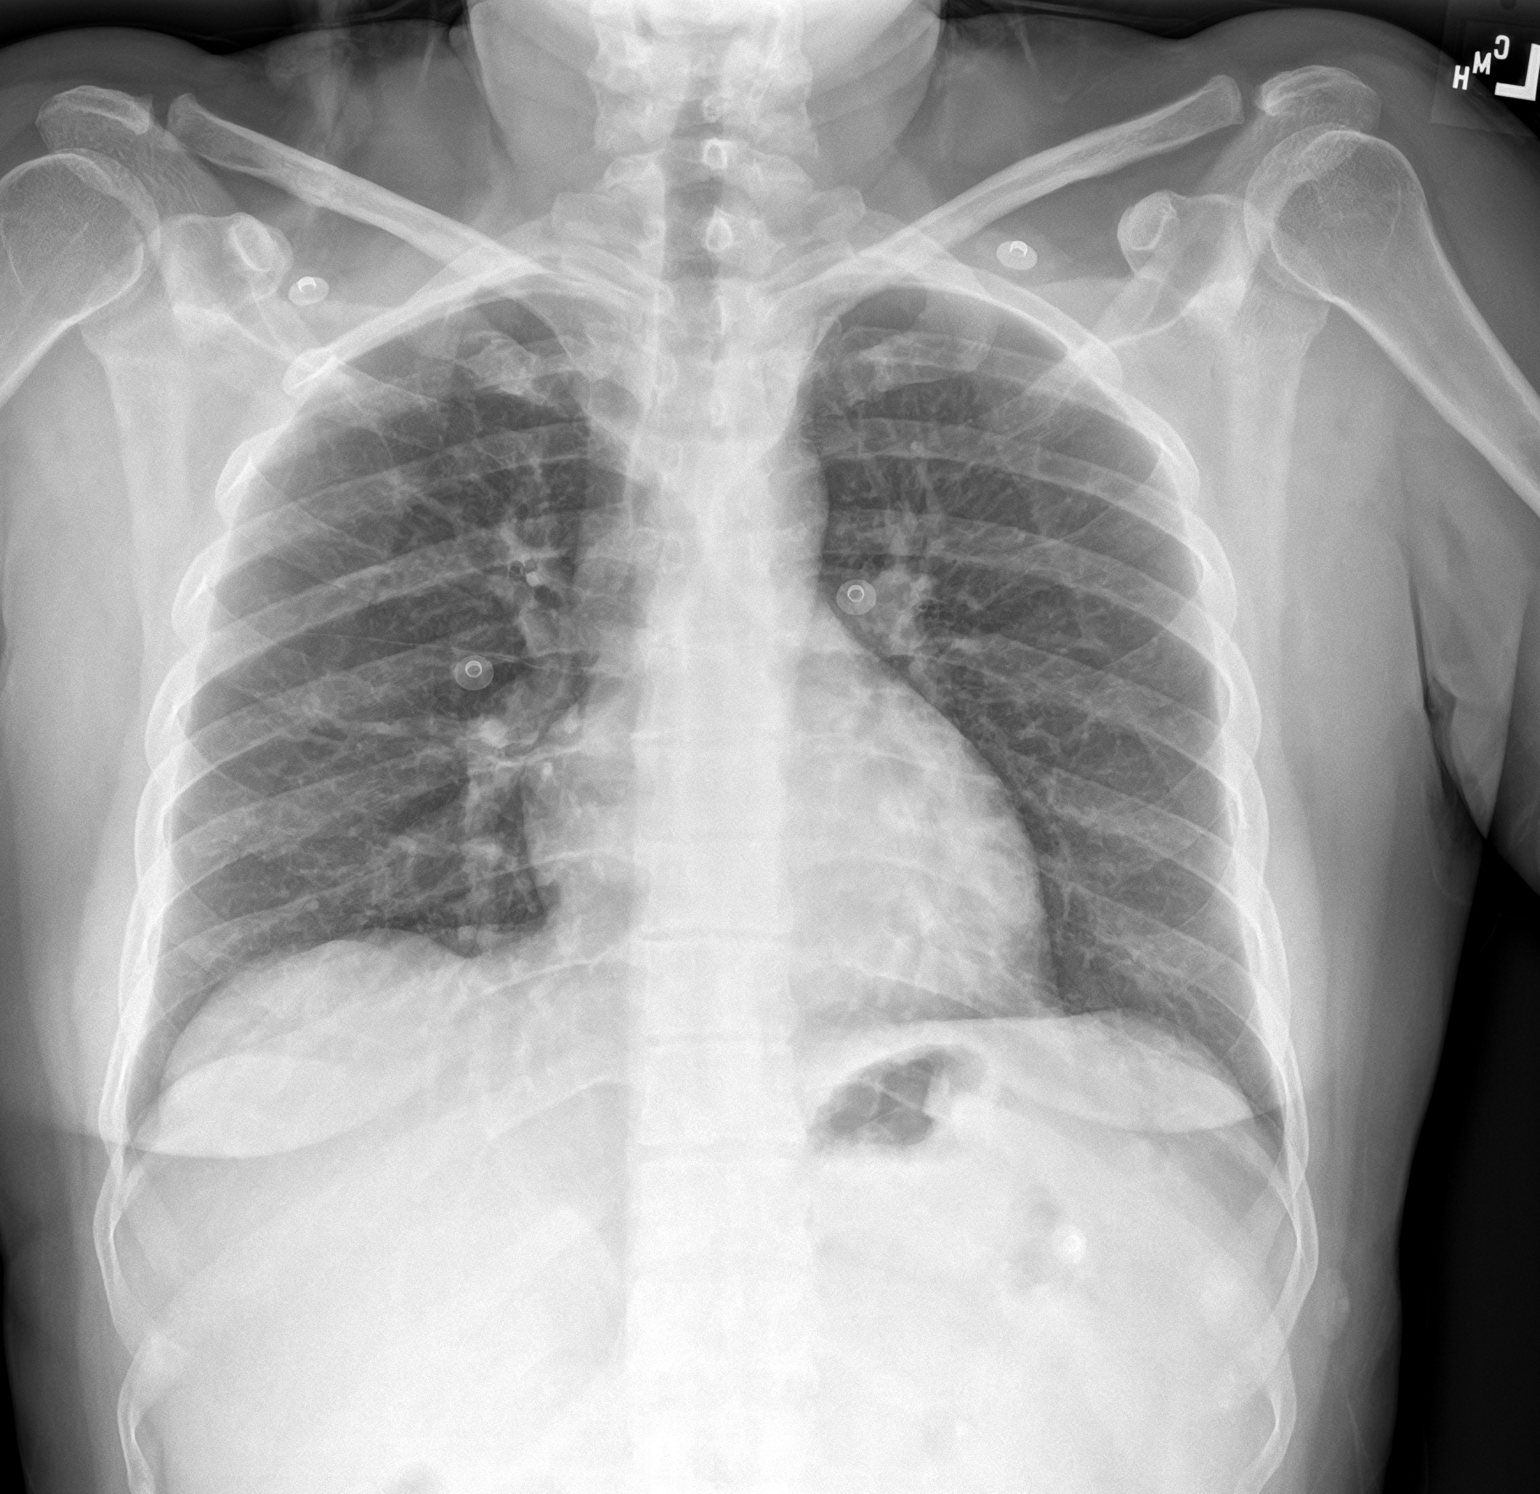
[im 2/2]
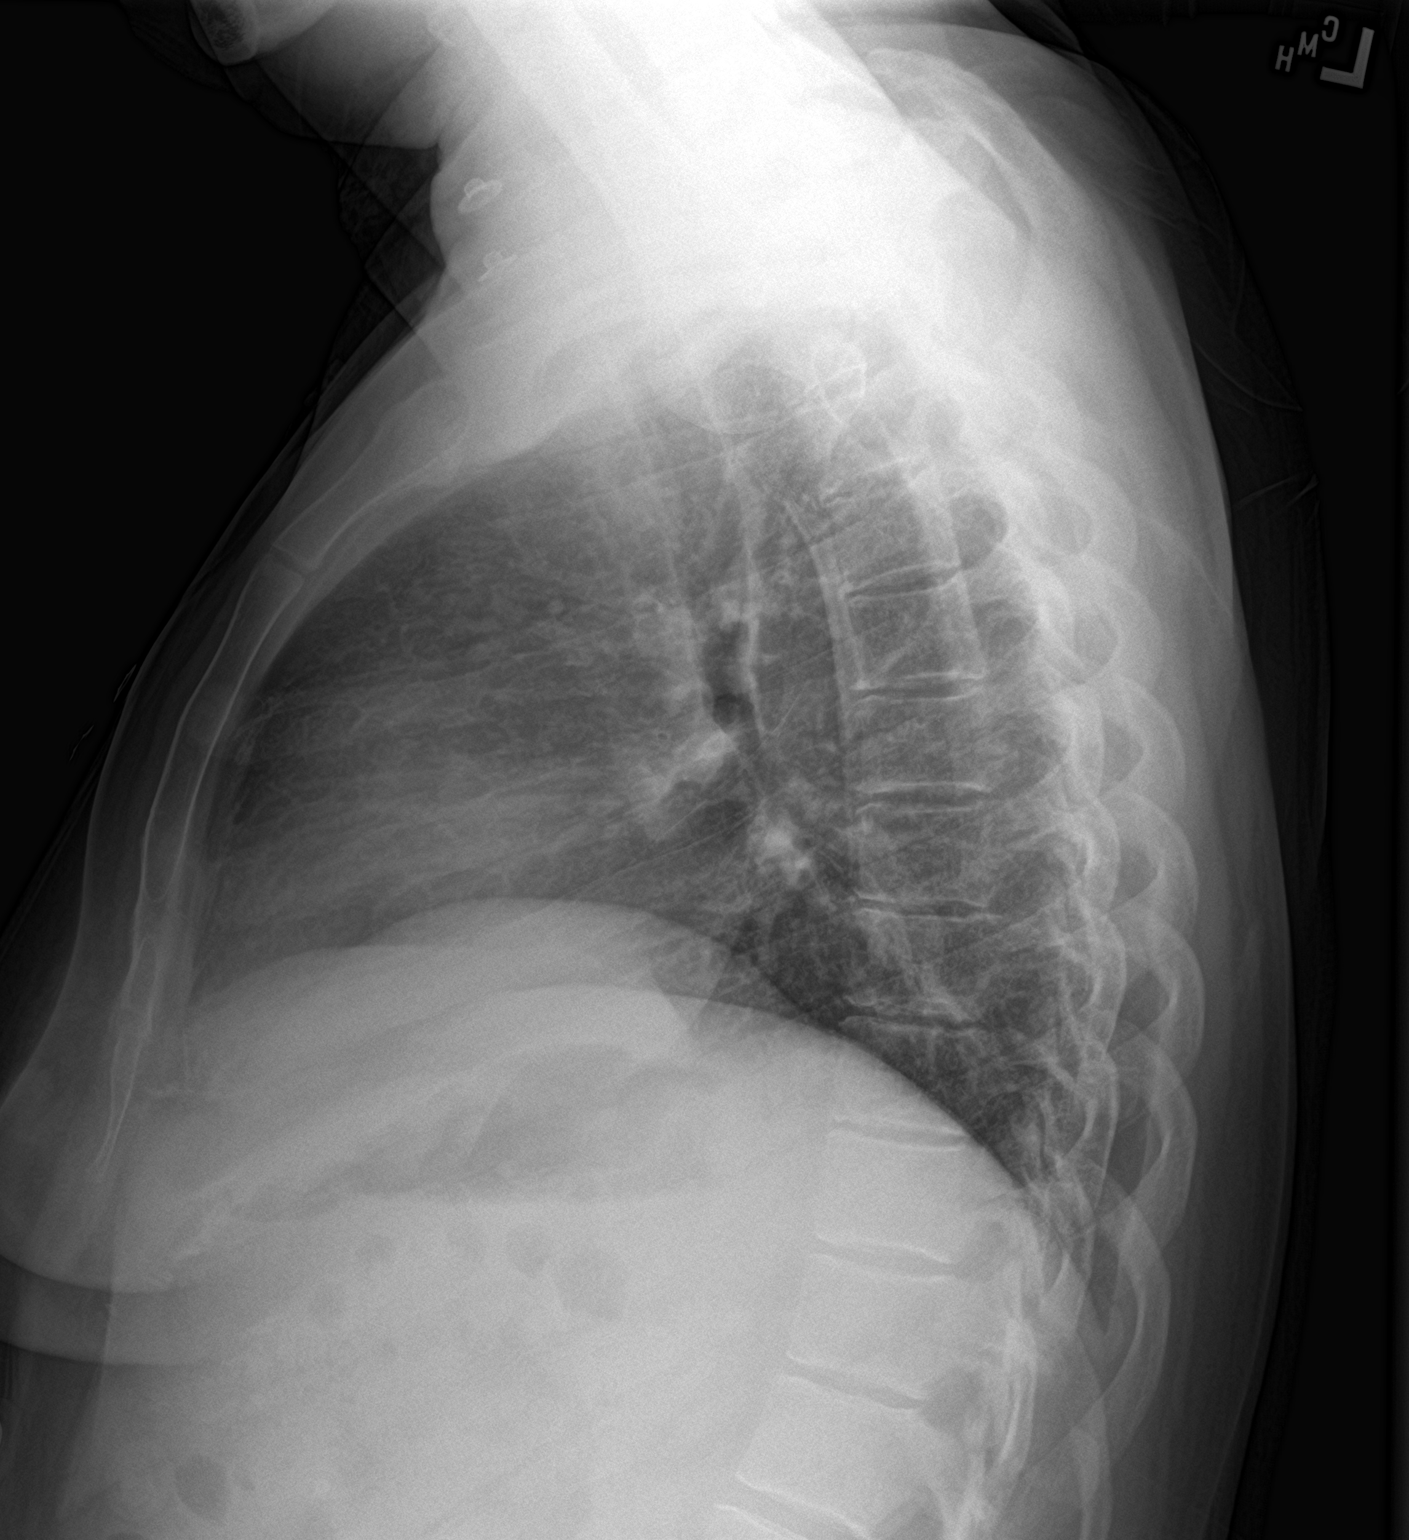

[2 of 2 positions shown; findings below may reference images not displayed]

FINDINGS: Cardiac shadows within normal limits. The lungs are clear
bilaterally. No acute bony abnormality is seen.
IMPRESSION: No active cardiopulmonary disease.

## 2022-07-30 ENCOUNTER — Other Ambulatory Visit: Payer: Self-pay | Admitting: Family Medicine

## 2022-07-30 DIAGNOSIS — Z1231 Encounter for screening mammogram for malignant neoplasm of breast: Secondary | ICD-10-CM

## 2024-03-24 ENCOUNTER — Other Ambulatory Visit: Payer: Self-pay

## 2024-03-24 DIAGNOSIS — Z1231 Encounter for screening mammogram for malignant neoplasm of breast: Secondary | ICD-10-CM
